# Patient Record
Sex: Male | Born: 1980 | Race: White | Hispanic: No | Marital: Married | Smoking: Former smoker
Health system: Southern US, Community
[De-identification: ages and names within clinical notes are randomized; demographics above are authoritative.]

## PROBLEM LIST (undated history)

## (undated) DIAGNOSIS — F988 Other specified behavioral and emotional disorders with onset usually occurring in childhood and adolescence: Secondary | ICD-10-CM

## (undated) DIAGNOSIS — S42301A Unspecified fracture of shaft of humerus, right arm, initial encounter for closed fracture: Secondary | ICD-10-CM

## (undated) DIAGNOSIS — S82892A Other fracture of left lower leg, initial encounter for closed fracture: Secondary | ICD-10-CM

## (undated) DIAGNOSIS — N200 Calculus of kidney: Secondary | ICD-10-CM

## (undated) HISTORY — DX: Unspecified fracture of shaft of humerus, right arm, initial encounter for closed fracture: S42.301A

## (undated) HISTORY — DX: Other specified behavioral and emotional disorders with onset usually occurring in childhood and adolescence: F98.8

## (undated) HISTORY — PX: APPENDECTOMY: SHX54

## (undated) HISTORY — PX: HERNIA REPAIR: SHX51

## (undated) HISTORY — DX: Other fracture of left lower leg, initial encounter for closed fracture: S82.892A

## (undated) HISTORY — DX: Calculus of kidney: N20.0

---

## 2006-01-18 ENCOUNTER — Ambulatory Visit: Payer: Self-pay | Admitting: Family Medicine

## 2006-02-26 ENCOUNTER — Ambulatory Visit: Payer: Self-pay | Admitting: Family Medicine

## 2006-05-11 ENCOUNTER — Ambulatory Visit: Payer: Self-pay | Admitting: Family Medicine

## 2006-08-14 ENCOUNTER — Emergency Department (HOSPITAL_COMMUNITY): Admission: EM | Admit: 2006-08-14 | Discharge: 2006-08-14 | Payer: Self-pay | Admitting: Emergency Medicine

## 2006-08-16 ENCOUNTER — Encounter: Payer: Self-pay | Admitting: Family Medicine

## 2007-04-02 ENCOUNTER — Ambulatory Visit: Payer: Self-pay | Admitting: Family Medicine

## 2007-04-02 DIAGNOSIS — K429 Umbilical hernia without obstruction or gangrene: Secondary | ICD-10-CM | POA: Insufficient documentation

## 2007-04-02 DIAGNOSIS — R350 Frequency of micturition: Secondary | ICD-10-CM

## 2007-04-02 LAB — CONVERTED CEMR LAB
Blood in Urine, dipstick: NEGATIVE
Glucose, Urine, Semiquant: NEGATIVE
Protein, U semiquant: NEGATIVE
Urobilinogen, UA: 0.2
WBC Urine, dipstick: NEGATIVE
pH: 7

## 2007-05-02 ENCOUNTER — Telehealth (INDEPENDENT_AMBULATORY_CARE_PROVIDER_SITE_OTHER): Payer: Self-pay | Admitting: *Deleted

## 2007-05-03 ENCOUNTER — Ambulatory Visit: Payer: Self-pay | Admitting: Family Medicine

## 2007-05-06 LAB — CONVERTED CEMR LAB
Albumin: 4.6 g/dL (ref 3.5–5.2)
Basophils Relative: 0.3 % (ref 0.0–1.0)
Bilirubin, Direct: 0.2 mg/dL (ref 0.0–0.3)
Eosinophils Absolute: 0 10*3/uL (ref 0.0–0.6)
GFR calc Af Amer: 133 mL/min
HCT: 45.4 % (ref 39.0–52.0)
Hemoglobin: 15.2 g/dL (ref 13.0–17.0)
Lymphocytes Relative: 12.1 % (ref 12.0–46.0)
MCHC: 33.5 g/dL (ref 30.0–36.0)
MCV: 93.3 fL (ref 78.0–100.0)
Monocytes Relative: 9.4 % (ref 3.0–11.0)
Neutrophils Relative %: 77.7 % — ABNORMAL HIGH (ref 43.0–77.0)
Platelets: 203 10*3/uL (ref 150–400)
Potassium: 4 meq/L (ref 3.5–5.1)
Sodium: 136 meq/L (ref 135–145)
TSH: 0.94 microintl units/mL (ref 0.35–5.50)
Total Bilirubin: 1.6 mg/dL — ABNORMAL HIGH (ref 0.3–1.2)
Total Protein: 7.5 g/dL (ref 6.0–8.3)

## 2007-09-20 ENCOUNTER — Ambulatory Visit: Payer: Self-pay | Admitting: Internal Medicine

## 2007-09-20 DIAGNOSIS — F410 Panic disorder [episodic paroxysmal anxiety] without agoraphobia: Secondary | ICD-10-CM

## 2007-10-31 ENCOUNTER — Ambulatory Visit: Payer: Self-pay | Admitting: Family Medicine

## 2007-10-31 DIAGNOSIS — F988 Other specified behavioral and emotional disorders with onset usually occurring in childhood and adolescence: Secondary | ICD-10-CM | POA: Insufficient documentation

## 2007-12-02 ENCOUNTER — Ambulatory Visit: Payer: Self-pay | Admitting: Family Medicine

## 2008-01-02 ENCOUNTER — Ambulatory Visit: Payer: Self-pay | Admitting: Family Medicine

## 2008-01-19 IMAGING — CR DG ABDOMEN 2V
2 series · 2 of 2 positions shown · non-contrast
Comparison: none

CLINICAL DATA: Abdominal pain and vomiting.  Distention.  
 ABDOMEN ? 2 VIEW:

[w abdomen upright]
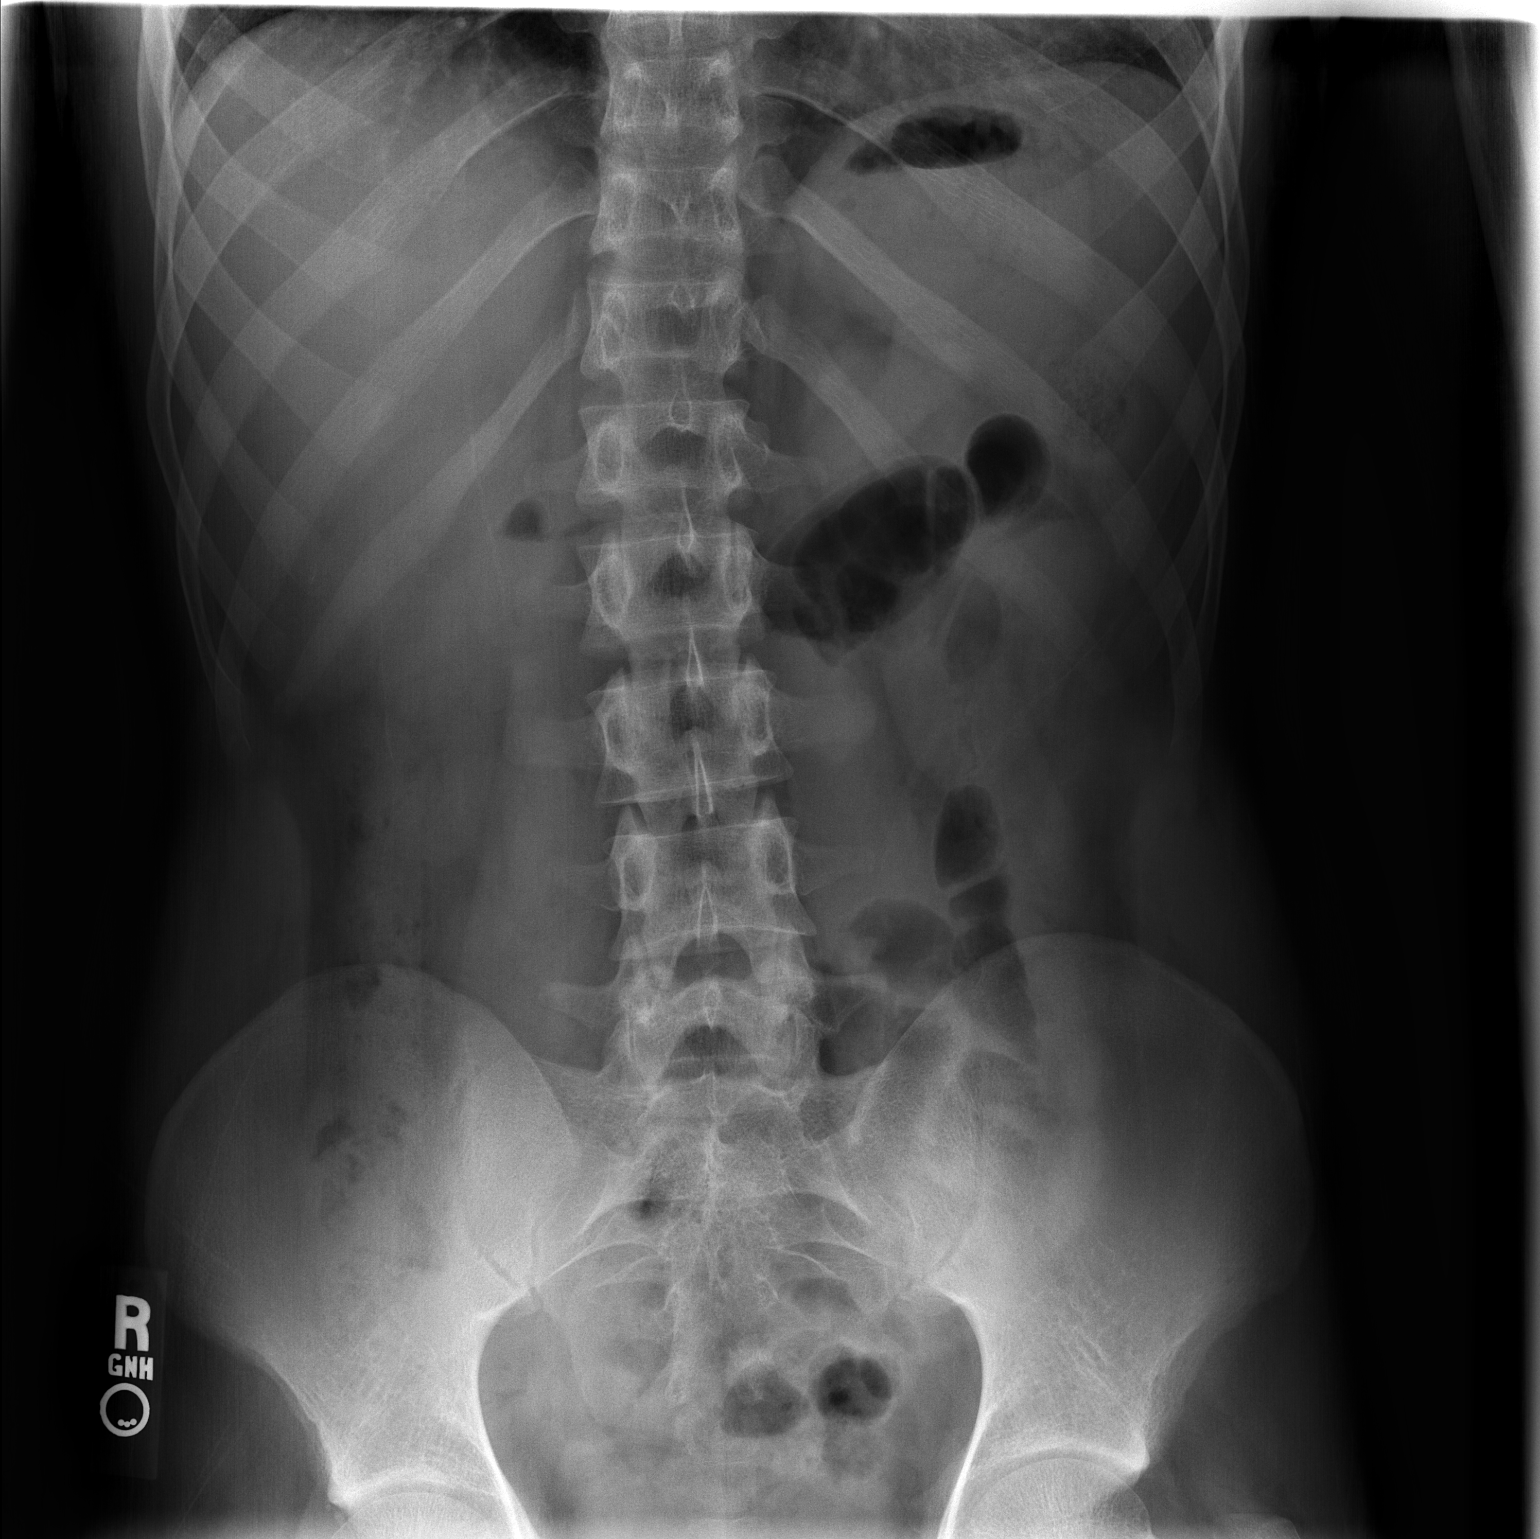

[t abdomen supine]
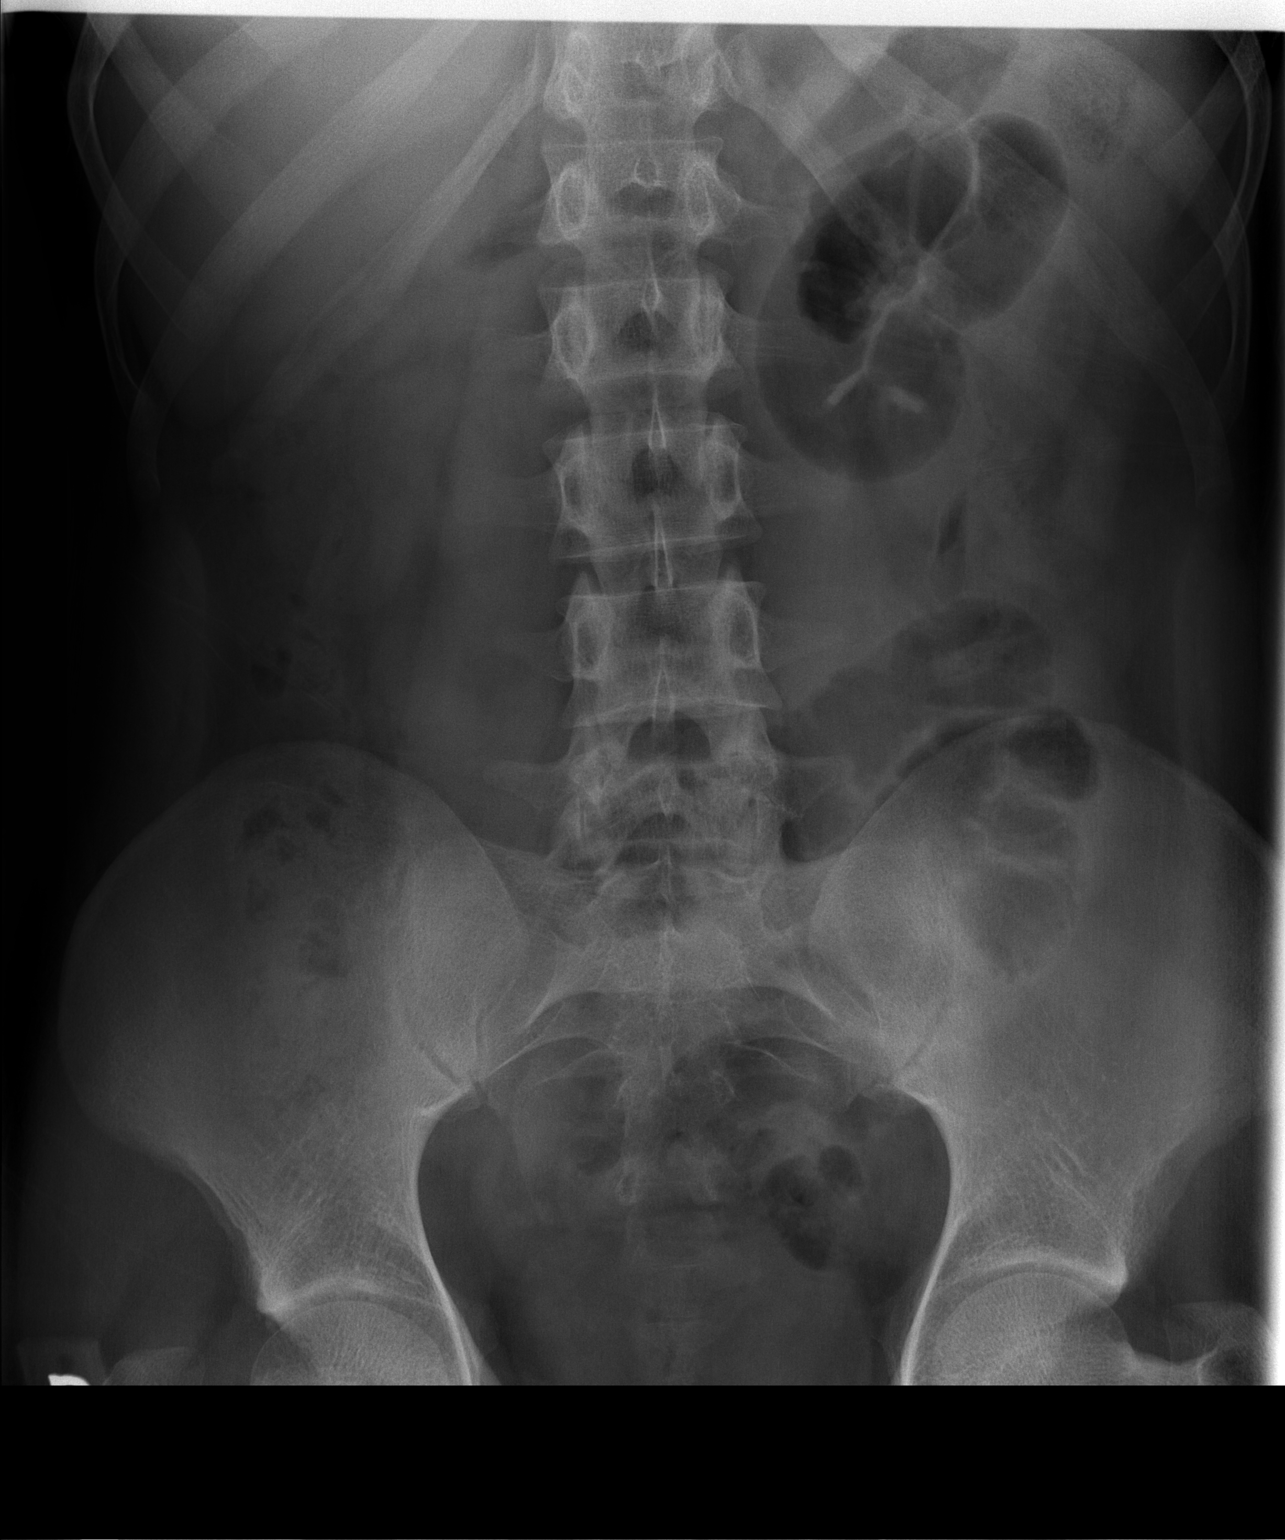

[2 of 2 positions shown; findings below may reference images not displayed]

FINDINGS: Multiple gas-filled nondilated small bowel loops are seen within the left abdomen.  Scattered bowel gas is seen throughout the colon.  This is a nonspecific, nonobstructive bowel gas pattern.  No radiopaque calculi are seen.  There is no evidence of free intraperitoneal air.
IMPRESSION: Nonspecific, nonobstructive bowel gas pattern.

## 2008-01-29 ENCOUNTER — Telehealth (INDEPENDENT_AMBULATORY_CARE_PROVIDER_SITE_OTHER): Payer: Self-pay | Admitting: *Deleted

## 2008-03-05 ENCOUNTER — Telehealth (INDEPENDENT_AMBULATORY_CARE_PROVIDER_SITE_OTHER): Payer: Self-pay | Admitting: Internal Medicine

## 2008-04-16 ENCOUNTER — Telehealth (INDEPENDENT_AMBULATORY_CARE_PROVIDER_SITE_OTHER): Payer: Self-pay | Admitting: Internal Medicine

## 2008-05-19 ENCOUNTER — Telehealth (INDEPENDENT_AMBULATORY_CARE_PROVIDER_SITE_OTHER): Payer: Self-pay | Admitting: Internal Medicine

## 2008-07-16 ENCOUNTER — Ambulatory Visit: Payer: Self-pay | Admitting: Family Medicine

## 2008-07-16 DIAGNOSIS — N200 Calculus of kidney: Secondary | ICD-10-CM

## 2008-08-26 ENCOUNTER — Telehealth (INDEPENDENT_AMBULATORY_CARE_PROVIDER_SITE_OTHER): Payer: Self-pay | Admitting: Internal Medicine

## 2008-12-18 ENCOUNTER — Telehealth (INDEPENDENT_AMBULATORY_CARE_PROVIDER_SITE_OTHER): Payer: Self-pay | Admitting: Internal Medicine

## 2009-01-12 ENCOUNTER — Ambulatory Visit: Payer: Self-pay | Admitting: Family Medicine

## 2009-02-03 ENCOUNTER — Telehealth (INDEPENDENT_AMBULATORY_CARE_PROVIDER_SITE_OTHER): Payer: Self-pay | Admitting: Internal Medicine

## 2009-03-03 ENCOUNTER — Ambulatory Visit: Payer: Self-pay | Admitting: Family Medicine

## 2009-03-05 ENCOUNTER — Ambulatory Visit: Payer: Self-pay | Admitting: Family Medicine

## 2009-04-06 ENCOUNTER — Telehealth (INDEPENDENT_AMBULATORY_CARE_PROVIDER_SITE_OTHER): Payer: Self-pay | Admitting: Internal Medicine

## 2009-06-18 ENCOUNTER — Telehealth: Payer: Self-pay | Admitting: Family Medicine

## 2010-03-28 ENCOUNTER — Ambulatory Visit: Payer: Self-pay | Admitting: Family Medicine

## 2010-04-28 ENCOUNTER — Ambulatory Visit: Payer: Self-pay | Admitting: Internal Medicine

## 2010-07-14 ENCOUNTER — Telehealth: Payer: Self-pay | Admitting: Family Medicine

## 2010-08-31 ENCOUNTER — Telehealth: Payer: Self-pay | Admitting: Family Medicine

## 2010-09-20 ENCOUNTER — Ambulatory Visit
Admission: RE | Admit: 2010-09-20 | Discharge: 2010-09-20 | Payer: Self-pay | Source: Home / Self Care | Attending: Family Medicine | Admitting: Family Medicine

## 2010-09-21 ENCOUNTER — Telehealth: Payer: Self-pay | Admitting: Family Medicine

## 2010-09-22 ENCOUNTER — Encounter: Payer: Self-pay | Admitting: Family Medicine

## 2010-09-22 ENCOUNTER — Telehealth: Payer: Self-pay | Admitting: Family Medicine

## 2010-09-27 NOTE — Progress Notes (Signed)
  Phone Note Call from Patient Call back at Work Phone [phone]   Caller: Patient Call For: Dr.Duncan Summary of Call: Pt was seeing Dr.Gutierrez.  He transferred to him from [place].  Pt's father,Gary Boom,saw you and really like you.  So,pt would like to transfer from Dr Sharen Hones to you. Can pt. transfer to you? Initial call taken by: Beau Fanny,  July 14, 2010 11:59 AM  Follow-up for Phone Call        If okay with Dr. Reece Agar.  If okay with you, please notify the staff to change the patient.  Follow-up by: Crawford Givens MD,  July 14, 2010 1:10 PM  Additional Follow-up for Phone Call Additional follow up Details #1::        fine by me.   Additional Follow-up by: Eustaquio Boyden  MD,  July 14, 2010 1:14 PM    Additional Follow-up for Phone Call Additional follow up Details #2::    L/M on pt's a.m. to let pt know he can schedule appt. w/ Dr.Duncan. Follow-up by: Beau Fanny,  July 14, 2010 1:29 PM  Additional Follow-up for Phone Call Additional follow up Details #3:: Details for Additional Follow-up Action Taken: noted.  Additional Follow-up by: Crawford Givens MD,  July 14, 2010 1:45 PM

## 2010-09-27 NOTE — Assessment & Plan Note (Signed)
Summary: TRANSFER FROM BILLIE/REFILL MED/CLE   Vital Signs:  Patient profile:   30 year old male Height:      69.5 inches Weight:      161.75 pounds BMI:     23.63 Temp:     98.2 degrees F oral Pulse rate:   64 / minute Pulse rhythm:   regular BP sitting:   118 / 72  (left arm) Cuff size:   regular  Vitals Entered By: Selena Batten Dance CMA Duncan Dull) (March 28, 2010 3:52 PM) CC: Refill meds   History of Present Illness: CC: refill meds  30 yo with h/o ADD and nephrolitihiaisis presents to establish and for refill of pain meds and ritalin.  Off ritalin for 4-5 months 2/2 insurance.  Feels having trouble concentrating at work without ritalin.  Did take ADD test in middle school and had parents/teachers fill out form.  Has come here since high school.  Does get stomach upset with ritalin.  Last kidney stone 1 year ago (usually gets vicodin from urologist but $$$ for appt).  Drinks plenty of water to control kidney stones.  Preventive Screening-Counseling & Management  Alcohol-Tobacco     Alcohol drinks/day: <1  Current Medications (verified): 1)  Vicodin 5-500 Mg  Tabs (Hydrocodone-Acetaminophen) .... 1/2 To 1 By Mouth Two Times A Day As Needed Pain 2)  Ritalin 10 Mg  Tabs (Methylphenidate Hcl) .... 1/2 Each Morning, Then 1 Mid Day For Add  Allergies: 1)  ! Sulfa  Past History:  Past Medical History: ADD dx in middle school h/o Kidney stones, sees urology and prescribed vicodin  Past Surgical History: Appendectomy umbilical herniorrhaphy as child Right arm fracture, no surgery Left ankle fracture, no surgery  Family History: Mother with breast cancer. No other cancers, HTN, DM, CVA, CAD  Social History: social smoker (cigarettes), EtOH on weekends, no rec drugs. Marital Status: married 02/26/2010 Children: 0 Occupation:courier in morning, print shop in afternoons  Review of Systems       per HPI  Physical Exam  General:  alert, well-developed, well-nourished, and  well-hydrated.   Mouth:  Oral mucosa and oropharynx without lesions or exudates.  Teeth in good repair. Lungs:  Normal respiratory effort, chest expands symmetrically. Lungs are clear to auscultation, no crackles or wheezes. Heart:  normal rate, regular rhythm, and no murmur.   Abdomen:  soft, non-tender, normal bowel sounds, no distention, no masses, no hepatomegaly, and no splenomegaly.   Extremities:  no edema either lower legs   Impression & Recommendations:  Problem # 1:  ADD (ICD-314.00) no records of ADD w/u in our paper chart.  Have sent release of info to Dr. Clarene Duke (pediatrician).  Advised if unable to obtain, would send to rpt testing.  Have refilled ritalin for now, however consider strattera vs TCA to help with adult ADD dx in future.  currently BP stable, weight/appetite stable.  Problem # 2:  RENAL CALCULUS (ICD-592.0) hx. refilled vicodin to have in case as another stone.  In future, change to NSAID/flomax combo as more effective.  Complete Medication List: 1)  Vicodin 5-500 Mg Tabs (Hydrocodone-acetaminophen) .... 1/2 to 1 by mouth two times a day as needed pain 2)  Ritalin 10 Mg Tabs (Methylphenidate hcl) .... 1/2 each morning, then 1 mid day for add  Patient Instructions: 1)  Please return in 1 month for follow up. 2)  Refill of ritalin and vicodin provided today. 3)  We don't have copy of your ADD work up so please call  us with your pediatrician's name. 4)  Pleasure to meet you today. Prescriptions: RITALIN 10 MG  TABS (METHYLPHENIDATE HCL) 1/2 each morning, then 1 mid day for ADD  #45 x 0   Entered and Authorized by:   Eustaquio Boyden  MD   Signed by:   Eustaquio Boyden  MD on 03/28/2010   Method used:   Print then Give to Patient   RxID:   [id number] VICODIN 5-500 MG  TABS (HYDROCODONE-ACETAMINOPHEN) 1/2 to 1 by mouth two times a day as needed pain  #30 x 0   Entered and Authorized by:   Eustaquio Boyden  MD   Signed by:   Eustaquio Boyden  MD on  03/28/2010   Method used:   Print then Give to Patient   RxID:   [id number]   Current Allergies (reviewed today): ! SULFA

## 2010-09-27 NOTE — Assessment & Plan Note (Signed)
Summary: ONE MONTH FOLLOW UP / LFW   Vital Signs:  Patient profile:   30 year old male Weight:      157 pounds Temp:     97.7 degrees F oral Pulse rate:   64 / minute Pulse rhythm:   regular BP sitting:   116 / 76  (left arm) Cuff size:   regular  Vitals Entered By: Selena Batten Dance CMA (AAMA) (April 28, 2010 4:14 PM) CC: 1 month follow up   History of Present Illness: CC: f/u ADHD  On ritalin taking 1 in morning and one after lunch.  Still giving stomach upset with med.  Slight appetite decrease, but no weight changes, HA.  Work seems to pick up in fall, slows during holidays and summer.  At work has to help sort, Neurosurgeon and print.  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  Ritalin affects golf game negatively.  Was on zoloft for panic attacks, didn't like side effects.    Dr. Clarene Duke - used to be [facility] pediatrics in [place].  never received records requested.  per patient quit smoking.  Allergies: 1)  ! Sulfa  Past History:  Social History: Last updated: 03/28/2010 social smoker (cigarettes), EtOH on weekends, no rec drugs. 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   Psych:  Cognition and judgment appear intact. Alert and cooperative with normal attention span and concentration. No apparent delusions, illusions, hallucinations   Impression & Recommendations:  Problem # 1:  ADD (ICD-314.00) still awaiting results from Dr. Fredirick Maudlin office.  If still no results in 1 month, will refer for testing.  Pt asks about adderall instead of ritalin as ritalin upsets stomache.  Rec trial of strattera.  RTC 1 mo for f/u.  BP remains stable, weight/appetite stable.  could cohnsider TCA (nortriptyline) as well.  discussed side effects of   Complete Medication List: 1)  Vicodin 5-500 Mg Tabs  (Hydrocodone-acetaminophen) .... 1/2 to 1 by mouth two times a day as needed pain 2)  Strattera 40 Mg Caps (Atomoxetine hcl) .... One by mouth daily  Patient Instructions: 1)  Please return in 1 month for follow up on ADHD. 2)  If we haven't received records from Dr. Clarene Duke, we will set you up with testing.  Rec stay off meds for 1 week prior. 3)  Start Medical sales representative. Prescriptions: STRATTERA 40 MG CAPS (ATOMOXETINE HCL) one by mouth daily  #30 x 1   Entered and Authorized by:   Eustaquio Boyden  MD   Signed by:   Eustaquio Boyden  MD on 04/28/2010   Method used:   [facility]        [facility] [facility]retail)       [address]       [place], [place]  [address]       Ph: [phone] or [phone]       Fax: [phone]   RxID:   [id number]   Current Allergies (reviewed today): ! SULFA

## 2010-09-28 ENCOUNTER — Telehealth: Payer: Self-pay | Admitting: Family Medicine

## 2010-09-29 NOTE — Progress Notes (Addendum)
Summary: prior auth needed for ritalin  Phone Note From Pharmacy   [facility] [id number] Summary of Call: Prior Berkley Harvey is needed for ritalin, form is on your desk.                Lowella Petties CMA, AAMA  September 22, 2010 11:36 AM   Follow-up for Phone Call        signed, please send in. thanks.  Follow-up by: Crawford Givens MD,  September 22, 2010 6:54 PM  Additional Follow-up for Phone Call Additional follow up Details #1::        Faxed and given to [facility]. Additional Follow-up by: Delilah Shan CMA (AAMA),  September 23, 2010 9:05 AM     Appended Document: prior auth needed for ritalin Prior auth given for ritalin, pharmacy advised.  Approval letter placed on doctor's desk for signture and scanning.

## 2010-09-29 NOTE — Assessment & Plan Note (Signed)
Vital Signs:  Patient profile:   30 year old male Height:      69.5 inches Weight:      160 pounds BMI:     23.37 Temp:     98.4 degrees F oral Pulse rate:   92 / minute Pulse rhythm:   regular BP sitting:   112 / 70  (left arm) Cuff size:   regular  Vitals Entered By: Delilah Shan CMA Duncan Dull) (September 20, 2010 4:06 PM) CC: Discuss med for ADHD, Hypertension Management   History of Present Illness: Insurance changed and he was asking about options at this point.  Per patient was tested and dx'd with ADD in childhood.  He wanted to come off meds in high school.  Was on ritalin when workload increased after high school.  Did okay with ritalin in the morning and afternoon with as needed dosing.  Some portions of the years were harder than others and his med use varied.  Wanted to change to time release formulation now.  He was worried about potential mood changes with the strattera didn't start this.    Compliant with meds: see above.  Off meds for months.   benefit from med (ie increase in concentration): yes, when on ritalin change in mood: no change in appetite:minimal Insomnia:no tremor: at first, but this resolved compliant with behavioral modification: yes, patient is working to improve this. some looser stools with ritalin last fall.  this was tolerable per patient, since he took it as needed.      Hypertension History:      Negative major cardiovascular risk factors include male age less than 67 years old and non-tobacco-user status.    Allergies: 1)  ! Sulfa  Past History:  Past Medical History: Last updated: 03/28/2010 ADD dx in middle school h/o Kidney stones, sees urology and prescribed vicodin  Past Surgical History: Last updated: 03/28/2010 Appendectomy umbilical herniorrhaphy as child Right arm fracture, no surgery Left ankle fracture, no surgery  Family History: Reviewed history from 03/28/2010 and no changes required. Father alive, healthy Mother  with breast cancer in remission as of 2012 No other cancers, HTN, DM, CVA, CAD  Social History: Reviewed history from 03/28/2010 and no changes required. social smoker (cigarettes), EtOH on weekends, no rec drugs. 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   enjoys golf, basketball  Review of Systems       See HPI.  Otherwise negative.    Physical Exam  General:  GEN: nad, alert and oriented, affect wnl and appropriate HEENT: mucous membranes moist NECK: supple w/o LA CV: rrr.  PULM: ctab, no inc wob ABD: soft, +bs EXT: no edema CN 2-12 wnl, s/s/dtr wnl x4.  No tremor.     Impression & Recommendations:  Problem # 1:  ADD (ICD-314.00) Start ritalin LA and notify the clinic if intolerant.  Call back with update in 2 weeks if not before.  He knows not to abuse, misuse, or take the medicine other than as prescribed.  He is aware of potential mood changes, sleep and appetite changes, along with need to follow up periodically for BP check and reeval.  He agrees.  >25 min spent with patient, at least half of which was spent on counseling [id number].  He is aware of behavioral modifications needed to help with ADD.    Complete Medication List: 1)  Vicodin 5-500  Mg Tabs (Hydrocodone-acetaminophen) .... 1/2 to 1 by mouth two times a day as needed pain 2)  Valtrex 1 Gm Tabs (Valacyclovir hcl) .... 2 by mouth two times a day for 1 day when oral blisters appear; follow up with md if not improving as needed 3)  Ritalin La 10 Mg Xr24h-cap (Methylphenidate hcl) .Marland Kitchen.. 1 by mouth qam  Hypertension Assessment/Plan:      The patient's hypertensive risk group is category A: No risk factors and no target organ damage.  Today's blood pressure is 112/70.     Patient Instructions: 1)  Talk to the pharmacy and see if there is a prior authorization on the medicine.  If there is, get  them to send me the form that I need to fill out.  I would take 1 tab in the AM and then call me with an update in 10-14 days, sooner if needed.  If you notice any problems on the medicine (sleep or appetite changes), call me.  Take care.   Prescriptions: RITALIN LA 10 MG XR24H-CAP (METHYLPHENIDATE HCL) 1 by mouth qAM  #30 x 0   Entered and Authorized by:   Crawford Givens MD   Signed by:   Crawford Givens MD on 09/20/2010   Method used:   Print then Give to Patient   RxID:   [id number]    Orders Added: 1)  Est. Patient Level IV [14782]    Current Allergies (reviewed today): ! SULFA

## 2010-09-29 NOTE — Assessment & Plan Note (Signed)
Summary: REFILL MED FOR ADHD/CLE  see prev note.   Allergies: 1)  ! Sulfa   Complete Medication List: 1)  Vicodin 5-500 Mg Tabs (Hydrocodone-acetaminophen) .... 1/2 to 1 by mouth two times a day as needed pain 2)  Valtrex 1 Gm Tabs (Valacyclovir hcl) .... 2 by mouth two times a day for 1 day when oral blisters appear; follow up with md if not improving as needed 3)  Ritalin La 10 Mg Xr24h-cap (Methylphenidate hcl) .Marland Kitchen.. 1 by mouth qam

## 2010-09-29 NOTE — Progress Notes (Signed)
Summary: wants valtrex   Phone Note Call from Patient Call back at Home Phone [phone] Call back at Work Phone [phone]   Caller: Patient Call For: Todd Givens MD Summary of Call: Patient is asking if he can get a rx for valtrex [facility]  He says that he has been having trouble with fever blisters.  Initial call taken by: Melody Comas,  August 31, 2010 3:05 PM  Follow-up for Phone Call        sent. Todd Givens MD  August 31, 2010 4:39 PM     New/Updated Medications: VALTREX 1 GM TABS (VALACYCLOVIR HCL) 2 by mouth two times a day for 1 day when oral blisters appear; follow up with MD if not improving Prescriptions: VALTREX 1 GM TABS (VALACYCLOVIR HCL) 2 by mouth two times a day for 1 day when oral blisters appear; follow up with MD if not improving  #10 x 1   Entered and Authorized by:   Todd Givens MD   Signed by:   Todd Givens MD on 08/31/2010   Method used:   [facility]        [facility]  [place] (retail)       [address]       [place], [place]  [address]       Ph: [phone] or [phone]       Fax: [phone]   RxID:   [id number]

## 2010-09-29 NOTE — Progress Notes (Signed)
Summary: ritalin  Phone Note Refill Request Message from:  Fax from Pharmacy on September 21, 2010 1:51 PM  Refills Requested: Medication #1:  RITALIN LA 10 MG XR24H-CAP 1 by mouth qAM.   Last Refilled: Jan 20, 1983 [facility] [phone]  Initial call taken by: Melody Comas,  September 21, 2010 1:52 PM  Follow-up for Phone Call        rx was printed and given to patient yesterday.  Please see if there is another concern, ie a PA that needs to be done.   Follow-up by: Crawford Givens MD,  September 21, 2010 5:29 PM  Additional Follow-up for Phone Call Additional follow up Details #1::        PA is on Dr. Lianne Bushy desk.  Additional Follow-up by: Melody Comas,  September 22, 2010 1:03 PM

## 2010-09-30 ENCOUNTER — Encounter: Payer: Self-pay | Admitting: Family Medicine

## 2010-09-30 ENCOUNTER — Telehealth: Payer: Self-pay | Admitting: Family Medicine

## 2010-10-05 NOTE — Progress Notes (Signed)
Summary: ritalin LA is too costly  Phone Note Call from Patient Call back at Work Phone [phone]   Caller: Patient Call For: Crawford Givens MD Summary of Call: Pt states his ritalin script is too expensive.  Pharmacist suggested timed released adderall or concerta.  If these wouldnt work pt will go back to plain  ritalin, can take 2 a day.  He has his ritalin LA script and will bring that back in when he picks up new script. Initial call taken by: Lowella Petties CMA, AAMA,  September 28, 2010 11:26 AM  Follow-up for Phone Call        He needs to call his insurance and see what they will cover.  I'll look at the options.   Follow-up by: Crawford Givens MD,  September 28, 2010 12:06 PM  Additional Follow-up for Phone Call Additional follow up Details #1::        Left message with male voice to have pt. return call.  Lugene Fuquay CMA (AAMA)  September 28, 2010 5:22 PM   Advised pt, he will call back.           Lowella Petties CMA, AAMA  September 29, 2010 9:26 AM Pt called back.  Adderall XR is covered by his insurance, so he wants to try that.  Please call when script is ready, he will bring in ritalin script. Additional Follow-up by: Lowella Petties CMA, AAMA,  September 29, 2010 9:54 AM    Additional Follow-up for Phone Call Additional follow up Details #2::    printed and signed, please have him turn in the old rx when he picks up the new one.  Document that the old rx was destroyed. Crawford Givens MD  September 29, 2010 10:08 AM   Patient Advised. Prescription left at front desk with instructions to collect the old Rx.  Lugene Fuquay CMA Osmar Howton Dull)  September 29, 2010 10:44 AM   Patient brought in old Rx. to be destroyed.  Picked up new Rx.  Lugene Fuquay CMA (AAMA)  September 29, 2010 4:55 PM   New/Updated Medications: ADDERALL XR 10 MG XR24H-CAP (AMPHETAMINE-DEXTROAMPHETAMINE) 1 by mouth once daily  in the AM Prescriptions: ADDERALL XR 10 MG XR24H-CAP (AMPHETAMINE-DEXTROAMPHETAMINE) 1 by mouth  once daily  in the AM  #30 x 0   Entered and Authorized by:   Crawford Givens MD   Signed by:   Crawford Givens MD on 09/29/2010   Method used:   Print then Give to Patient   RxID:   [id number]

## 2010-10-05 NOTE — Progress Notes (Signed)
Summary: prior Berkley Harvey is needed for adderall  Phone Note From Pharmacy   [facility] [id number] Summary of Call: Prior Berkley Harvey is now needed for adderall, form is on your desk.              Lowella Petties CMA, AAMA  September 30, 2010 10:33 AM   Follow-up for Phone Call        done, please fax. Crawford Givens MD  September 30, 2010 11:02 AM   Faxed and given to [place]. Lugene Fuquay CMA (AAMA)  September 30, 2010 11:17 AM

## 2010-10-13 NOTE — Medication Information (Signed)
Summary: Prior authorization  Prior authorization   Imported By: Kassie Mends 10/04/2010 08:18:01  _____________________________________________________________________  External Attachment:    Type:   Image     Comment:   External Document

## 2010-10-25 NOTE — Medication Information (Signed)
Summary: Prior Authorization & Approval for Adderall/[facility]  Prior Authorization & Approval for Adderall/[facility]   Imported By: Lanelle Bal 10/17/2010 15:37:03  _____________________________________________________________________  External Attachment:    Type:   Image     Comment:   External Document

## 2010-11-07 ENCOUNTER — Telehealth: Payer: Self-pay | Admitting: Family Medicine

## 2010-11-15 NOTE — Progress Notes (Signed)
Summary: refill request for adderall, valtrex  Phone Note Refill Request Call back at Work Phone [phone] Message from:  Patient  Refills Requested: Medication #1:  ADDERALL XR 10 MG XR24H-CAP 1 by mouth once daily  in the AM.  Medication #2:  VALTREX 1 GM TABS 2 by mouth two times a day for 1 day when oral blisters appear; follow up with MD if not improving as needed Please call pt when ready.  He has one adderall to take tomorrow.  Initial call taken by: Lowella Petties CMA, AAMA,  November 07, 2010 3:13 PM  Follow-up for Phone Call        please give to patient.  thanks. Crawford Givens MD  November 07, 2010 5:24 PM   Patient Advised. Prescription left at front desk.  Follow-up by: Delilah Shan CMA (AAMA),  November 08, 2010 8:14 AM    Prescriptions: ADDERALL XR 10 MG XR24H-CAP (AMPHETAMINE-DEXTROAMPHETAMINE) 1 by mouth once daily  in the AM  #30 x 0   Entered and Authorized by:   Crawford Givens MD   Signed by:   Crawford Givens MD on 11/07/2010   Method used:   Print then Give to Patient   RxID:   [id number] VALTREX 1 GM TABS (VALACYCLOVIR HCL) 2 by mouth two times a day for 1 day when oral blisters appear; follow up with MD if not improving as needed  #20 x 1   Entered and Authorized by:   Crawford Givens MD   Signed by:   Crawford Givens MD on 11/07/2010   Method used:   Print then Give to Patient   RxID:   [id number]

## 2010-12-19 ENCOUNTER — Other Ambulatory Visit: Payer: Self-pay | Admitting: *Deleted

## 2010-12-19 MED ORDER — AMPHETAMINE-DEXTROAMPHET ER 10 MG PO CP24: 10.0000 mg | ORAL_CAPSULE | ORAL | Status: DC

## 2010-12-19 NOTE — Telephone Encounter (Signed)
Printed, please give to patient.  

## 2010-12-19 NOTE — Telephone Encounter (Signed)
Patient advised, Rx. Left at front desk.

## 2011-01-30 ENCOUNTER — Other Ambulatory Visit: Payer: Self-pay | Admitting: *Deleted

## 2011-01-30 MED ORDER — AMPHETAMINE-DEXTROAMPHET ER 10 MG PO CP24: 10.0000 mg | ORAL_CAPSULE | ORAL | Status: DC

## 2011-01-30 NOTE — Telephone Encounter (Signed)
Please call pt when ready.

## 2011-01-30 NOTE — Telephone Encounter (Signed)
Please  give to patient

## 2011-01-31 NOTE — Telephone Encounter (Signed)
Patient notified that rx is up front and ready for pickup. 

## 2011-02-27 ENCOUNTER — Other Ambulatory Visit: Payer: Self-pay | Admitting: *Deleted

## 2011-02-27 NOTE — Telephone Encounter (Signed)
Please call patient when rx are ready

## 2011-02-27 NOTE — Telephone Encounter (Signed)
Will print at clinic. Then please give to pt.  Thanks.

## 2011-02-28 MED ORDER — AMPHETAMINE-DEXTROAMPHET ER 10 MG PO CP24: 10.0000 mg | ORAL_CAPSULE | ORAL | Status: DC

## 2011-02-28 MED ORDER — VALACYCLOVIR HCL 1 G PO TABS: ORAL_TABLET | ORAL | Status: DC

## 2011-02-28 NOTE — Telephone Encounter (Signed)
Please give to pt.  

## 2011-02-28 NOTE — Telephone Encounter (Signed)
Patient picked up written rx at front desk.

## 2011-04-10 ENCOUNTER — Other Ambulatory Visit: Payer: Self-pay | Admitting: *Deleted

## 2011-04-10 MED ORDER — AMPHETAMINE-DEXTROAMPHET ER 10 MG PO CP24: 10.0000 mg | ORAL_CAPSULE | ORAL | Status: DC

## 2011-04-10 NOTE — Telephone Encounter (Signed)
Patient notified that rx is up front and ready for pickup. 

## 2011-04-10 NOTE — Telephone Encounter (Signed)
Please give to pt and set up an OV for this fall.  Thanks.

## 2011-04-10 NOTE — Telephone Encounter (Signed)
Please call when ready.

## 2011-05-26 ENCOUNTER — Other Ambulatory Visit: Payer: Self-pay | Admitting: *Deleted

## 2011-05-26 NOTE — Telephone Encounter (Signed)
Please call pt when ready.

## 2011-05-28 MED ORDER — AMPHETAMINE-DEXTROAMPHET ER 10 MG PO CP24: 10.0000 mg | ORAL_CAPSULE | ORAL | Status: DC

## 2011-05-28 NOTE — Telephone Encounter (Signed)
Please give to pt and schedule for 30 min OV in next 3 months.  Thanks.

## 2011-05-29 MED ORDER — AMPHETAMINE-DEXTROAMPHET ER 10 MG PO CP24: 10.0000 mg | ORAL_CAPSULE | ORAL | Status: DC

## 2011-05-29 NOTE — Telephone Encounter (Signed)
Prev rx destroyed, new rx printed.

## 2011-05-29 NOTE — Telephone Encounter (Signed)
Where is Rx

## 2011-05-29 NOTE — Telephone Encounter (Signed)
Patient advised.  Rx. Left at front desk for pick up. 

## 2011-07-24 ENCOUNTER — Other Ambulatory Visit: Payer: Self-pay | Admitting: *Deleted

## 2011-07-24 MED ORDER — AMPHETAMINE-DEXTROAMPHET ER 10 MG PO CP24: 10.0000 mg | ORAL_CAPSULE | ORAL | Status: DC

## 2011-07-24 NOTE — Telephone Encounter (Signed)
Please give to pt and schedule a yearly ADD visit.  Thanks.

## 2011-07-24 NOTE — Telephone Encounter (Signed)
Please call pt when ready.

## 2011-07-24 NOTE — Telephone Encounter (Signed)
Pt. Advised.  Rx. Left at front desk for pick up.

## 2011-09-04 ENCOUNTER — Other Ambulatory Visit: Payer: Self-pay | Admitting: *Deleted

## 2011-09-04 MED ORDER — AMPHETAMINE-DEXTROAMPHET ER 10 MG PO CP24: 10.0000 mg | ORAL_CAPSULE | ORAL | Status: DC

## 2011-09-04 NOTE — Telephone Encounter (Signed)
Patient called stating that he needs a refill on his Adderall. Please call when ready for pickup.

## 2011-09-04 NOTE — Telephone Encounter (Signed)
Schedule f/u this spring, visit.  Thanks.  rx printed.

## 2011-09-04 NOTE — Telephone Encounter (Signed)
Patient advised.  Rx left at front desk for pick up. 

## 2011-11-14 ENCOUNTER — Other Ambulatory Visit: Payer: Self-pay | Admitting: *Deleted

## 2011-11-15 MED ORDER — AMPHETAMINE-DEXTROAMPHET ER 10 MG PO CP24: 10.0000 mg | ORAL_CAPSULE | ORAL | Status: DC

## 2011-11-15 NOTE — Telephone Encounter (Signed)
Patient advised.  Rx left at front desk for pick up. 

## 2011-11-15 NOTE — Telephone Encounter (Signed)
Need OV.  Printed.

## 2011-12-08 ENCOUNTER — Encounter: Payer: Self-pay | Admitting: Family Medicine

## 2011-12-11 ENCOUNTER — Encounter: Payer: Self-pay | Admitting: Family Medicine

## 2011-12-11 ENCOUNTER — Ambulatory Visit: Payer: Self-pay | Admitting: Family Medicine

## 2011-12-11 ENCOUNTER — Ambulatory Visit (INDEPENDENT_AMBULATORY_CARE_PROVIDER_SITE_OTHER): Payer: Self-pay | Admitting: Family Medicine

## 2011-12-11 VITALS — BP 98/58 | HR 72 | Temp 98.6°F | Ht 70.0 in | Wt 155.8 lb

## 2011-12-11 DIAGNOSIS — F988 Other specified behavioral and emotional disorders with onset usually occurring in childhood and adolescence: Secondary | ICD-10-CM

## 2011-12-11 NOTE — Patient Instructions (Signed)
Call the pharmacy and ask about the prior authorization.  Don't change your meds in the meantime.  Take care.

## 2011-12-11 NOTE — Progress Notes (Signed)
ADD Compliant with meds: yes, taking everyday benefit from med (ie increase in concentration): yes change in mood: no change in appetite: intermittent, can decrease appetite mildly Insomnia: no Tremor: no  compliant with behavioral modification: yes  ROS: See HPI.  Otherwise noncontributory.   Meds, vitals, and allergies reviewed.   GEN: nad, alert and oriented, affect wnl and appropriate HEENT: mucous membranes moist NECK: supple w/o LA CV: rrr.  PULM: ctab, no inc wob ABD: soft, +bs EXT: no edema CN 2-12 wnl, s/s/dtr wnl x4.  No tremor.

## 2011-12-12 NOTE — Assessment & Plan Note (Signed)
Doing well, continue current meds.  He'll check on the PA for the meds at the pharmacy.  Continue as is, he'll notify us if there are more troubles with the PA.  D/w pt about lifestyle interventions to get the most use out of meds.

## 2011-12-19 ENCOUNTER — Telehealth: Payer: Self-pay | Admitting: Family Medicine

## 2011-12-19 ENCOUNTER — Telehealth: Payer: Self-pay | Admitting: *Deleted

## 2011-12-19 NOTE — Telephone Encounter (Signed)
Received faxed request from pharmacy for prior authorization on Amphetamine ER 10 mg. Form from [facility] is in you in box to complete and fax back.

## 2011-12-19 NOTE — Telephone Encounter (Signed)
Pt has insurance with [employer] and said that he received a bill and insurance should have paid. Sending to billing/coding for review.  Advised patient to call patient accounting customer service number listed on statement to follow up as well.

## 2011-12-19 NOTE — Telephone Encounter (Deleted)
Received request from pharmacy for prior authorization on Amphetamine ER 10 mg.  Forms are in your in box from [facility] to complete and fax back.

## 2011-12-22 NOTE — Telephone Encounter (Signed)
Done, please scan and send in.  Thanks.

## 2011-12-22 NOTE — Telephone Encounter (Signed)
Approval received today, given to Rena to attach to PA request.

## 2011-12-26 NOTE — Telephone Encounter (Signed)
Sent to [employer] for review and follow up with patient.

## 2012-02-02 NOTE — Telephone Encounter (Signed)
Patient did not present with insurance card and we called back and obtained insurance and updated his information.  Billing was rebilling with new insurance information and it should be paid by insurance.  Called patient and informed him.

## 2012-02-06 ENCOUNTER — Other Ambulatory Visit: Payer: Self-pay

## 2012-02-06 MED ORDER — AMPHETAMINE-DEXTROAMPHET ER 10 MG PO CP24: 10.0000 mg | ORAL_CAPSULE | ORAL | Status: DC

## 2012-02-06 NOTE — Telephone Encounter (Signed)
Printed.  Is on old med list.  Give to pt when signed.

## 2012-02-06 NOTE — Telephone Encounter (Signed)
Pt request rx for Adderall XR 10 mg. Not on med list. Call when ready for pick up.

## 2012-02-07 NOTE — Telephone Encounter (Signed)
Left message on cell phone voice mail advising patient script is ready for pick up. 

## 2012-03-11 ENCOUNTER — Other Ambulatory Visit: Payer: Self-pay

## 2012-03-11 NOTE — Telephone Encounter (Signed)
Pt left v/m requesting rx Adderall. Call when ready for pick up. 

## 2012-03-12 MED ORDER — AMPHETAMINE-DEXTROAMPHET ER 10 MG PO CP24: 10.0000 mg | ORAL_CAPSULE | ORAL | Status: DC

## 2012-03-12 NOTE — Telephone Encounter (Signed)
Printed, please give to patient after I sign. Thanks.

## 2012-03-12 NOTE — Telephone Encounter (Signed)
Patient advised.  Rx left at front desk for pick up. 

## 2012-04-17 ENCOUNTER — Other Ambulatory Visit: Payer: Self-pay

## 2012-04-17 MED ORDER — AMPHETAMINE-DEXTROAMPHET ER 10 MG PO CP24: 10.0000 mg | ORAL_CAPSULE | ORAL | Status: DC

## 2012-04-17 NOTE — Telephone Encounter (Signed)
Pt request rx Adderall.call when ready for pick up. 

## 2012-04-17 NOTE — Telephone Encounter (Signed)
Done. Please give to patient.

## 2012-04-17 NOTE — Telephone Encounter (Signed)
Patient notified by telephone that rx is up front and ready for pickup. 

## 2012-05-31 ENCOUNTER — Other Ambulatory Visit: Payer: Self-pay

## 2012-05-31 MED ORDER — AMPHETAMINE-DEXTROAMPHET ER 10 MG PO CP24: 10.0000 mg | ORAL_CAPSULE | ORAL | Status: DC

## 2012-05-31 NOTE — Telephone Encounter (Signed)
Patient advised.  Rx left at front desk for pick up. 

## 2012-05-31 NOTE — Telephone Encounter (Signed)
Pt request rx for adderall. Call when ready for pick up. 

## 2012-05-31 NOTE — Telephone Encounter (Signed)
Please give to patient.  Thanks. 

## 2012-08-05 ENCOUNTER — Other Ambulatory Visit: Payer: Self-pay

## 2012-08-05 NOTE — Telephone Encounter (Signed)
Pt left v/m requesting rx adderall. Call when ready for pick up. 

## 2012-08-06 MED ORDER — AMPHETAMINE-DEXTROAMPHET ER 10 MG PO CP24: 10.0000 mg | ORAL_CAPSULE | ORAL | Status: DC

## 2012-08-06 NOTE — Telephone Encounter (Signed)
Please give to patient.  Thanks. 

## 2012-08-06 NOTE — Telephone Encounter (Signed)
Patient advised.  Rx left at front desk for pick up. 

## 2012-10-30 ENCOUNTER — Other Ambulatory Visit: Payer: Self-pay

## 2012-10-30 MED ORDER — VALACYCLOVIR HCL 1 G PO TABS: ORAL_TABLET | ORAL | Status: DC

## 2012-10-30 MED ORDER — AMPHETAMINE-DEXTROAMPHET ER 10 MG PO CP24: 10.0000 mg | ORAL_CAPSULE | ORAL | Status: DC

## 2012-10-30 MED ORDER — HYDROCODONE-ACETAMINOPHEN 5-325 MG PO TABS: 0.5000 | ORAL_TABLET | Freq: Two times a day (BID) | ORAL | Status: DC | PRN

## 2012-10-30 NOTE — Telephone Encounter (Signed)
Valtrex sent, others printed.  I'll sign when I get to clinic.

## 2012-10-30 NOTE — Telephone Encounter (Signed)
Patient notified as instructed by telephone v/m that valtrex was sent to [facility] and adderall and hydrocodone apap rx is at front desk for pick up.

## 2012-10-30 NOTE — Telephone Encounter (Signed)
Pt request written rx for adderall and hydrocodone apap;? no longer can get the hydrocodone apap 5-500; can the apap be reduced to 325?Marland Kitchen Pt also request valtrex sent electronically to [facility] due to having a fever blister now.Please advise. Call when rx ready for pick up.

## 2013-01-08 ENCOUNTER — Other Ambulatory Visit: Payer: Self-pay

## 2013-01-08 MED ORDER — AMPHETAMINE-DEXTROAMPHET ER 10 MG PO CP24: 10.0000 mg | ORAL_CAPSULE | ORAL | Status: DC

## 2013-01-08 NOTE — Telephone Encounter (Signed)
Patient notified that script is up front and ready for pickup. Patient stated that he will schedule the office visit when he comes in to pick the script up.

## 2013-01-08 NOTE — Telephone Encounter (Signed)
Pt request rx Adderall XR. Call when ready for pick up.

## 2013-01-08 NOTE — Telephone Encounter (Signed)
Printed.  Needs OV scheduled.

## 2013-01-22 ENCOUNTER — Encounter: Payer: Self-pay | Admitting: Family Medicine

## 2013-01-22 ENCOUNTER — Telehealth: Payer: Self-pay | Admitting: Family Medicine

## 2013-01-22 DIAGNOSIS — F988 Other specified behavioral and emotional disorders with onset usually occurring in childhood and adolescence: Secondary | ICD-10-CM

## 2013-01-22 NOTE — Telephone Encounter (Signed)
Have rep from toxicology call the patient.   Based on policy, he can't get an other rxs until he signs/follows through with screening.   If he was given the rx, I would notify the pharmacy that we can't fill anything else unless he meets the clinic requirements.  If the rx wasn't given, then destroy the rx.  Thanks.  I updated his chart.

## 2013-01-22 NOTE — Telephone Encounter (Signed)
Patient came in to pick up Adderall XR prescription.  I scheduled a follow up appointment per Dr. Lianne Bushy note on prescription envelope.  Todd Reed requested 3 weeks, scheduled for 02/12/13 3:30 pm.  Printed the controlled substance agreement and gave to patient, explaining that he would need to go through to the lab prior to getting his prescription.  He handed agreement back to me and stated he was "not doing that" he just wanted to get his prescription.  He stated he did not have time and was going on vacation.  Todd Reed took the appointment sheet for his 02/12/13 appointment time and stated that he may have to cancel the appointment and start getting his medication in [place].

## 2013-01-23 NOTE — Telephone Encounter (Signed)
Spoke with Hansel Starling and this pt was advise of all of this information by her yesterday when he refused to get sign the agreement, and the front desk already destroyed his Rx

## 2013-02-12 ENCOUNTER — Ambulatory Visit: Payer: BC Managed Care – PPO | Admitting: Family Medicine

## 2013-02-19 ENCOUNTER — Encounter: Payer: Self-pay | Admitting: Family Medicine

## 2013-02-19 ENCOUNTER — Telehealth: Payer: Self-pay | Admitting: Family Medicine

## 2013-02-19 NOTE — Telephone Encounter (Signed)
Noted, thanks!

## 2013-02-19 NOTE — Telephone Encounter (Signed)
Todd Reed came in the office today wanting to pick up a rx. After looking at previous notes, I informed the patient that he needs to update his controlled substance agreement. I also told him that since he declined to update his agreement the last time, and cancelled his appointment on 02/12/13, he would need to make another appointment and do the urinalysis before he gets this med refilled. I explained to him that this process would not take much of his time. He said he was at work and would come back later.

## 2014-01-28 ENCOUNTER — Telehealth: Payer: Self-pay

## 2014-01-28 MED ORDER — VALACYCLOVIR HCL 1 G PO TABS: ORAL_TABLET | ORAL | Status: DC

## 2014-01-28 NOTE — Telephone Encounter (Signed)
Pt left v/m; pt has fever blister and request refill on valacyclovir to [facility]. Pt last seen 12/11/11 and no future appt scheduled. Please advise.

## 2014-01-28 NOTE — Telephone Encounter (Signed)
Sent, no other refills until he can re-est care.

## 2014-04-16 ENCOUNTER — Encounter (INDEPENDENT_AMBULATORY_CARE_PROVIDER_SITE_OTHER): Payer: Self-pay

## 2014-04-16 ENCOUNTER — Ambulatory Visit (INDEPENDENT_AMBULATORY_CARE_PROVIDER_SITE_OTHER): Payer: BC Managed Care – PPO | Admitting: Family Medicine

## 2014-04-16 ENCOUNTER — Encounter: Payer: Self-pay | Admitting: Family Medicine

## 2014-04-16 ENCOUNTER — Encounter: Payer: Self-pay | Admitting: *Deleted

## 2014-04-16 VITALS — BP 102/64 | HR 67 | Temp 98.0°F | Wt 153.8 lb

## 2014-04-16 DIAGNOSIS — R21 Rash and other nonspecific skin eruption: Secondary | ICD-10-CM

## 2014-04-16 DIAGNOSIS — N200 Calculus of kidney: Secondary | ICD-10-CM

## 2014-04-16 DIAGNOSIS — F988 Other specified behavioral and emotional disorders with onset usually occurring in childhood and adolescence: Secondary | ICD-10-CM

## 2014-04-16 MED ORDER — DOXYCYCLINE HYCLATE 100 MG PO TABS: 100.0000 mg | ORAL_TABLET | Freq: Two times a day (BID) | ORAL | Status: DC

## 2014-04-16 MED ORDER — TRIAMCINOLONE ACETONIDE 0.1 % EX CREA: 1.0000 "application " | TOPICAL_CREAM | Freq: Two times a day (BID) | CUTANEOUS | Status: DC

## 2014-04-16 MED ORDER — HYDROCODONE-ACETAMINOPHEN 5-325 MG PO TABS: 0.5000 | ORAL_TABLET | Freq: Two times a day (BID) | ORAL | Status: DC | PRN

## 2014-04-16 NOTE — Patient Instructions (Signed)
This looks like irritation, less likely to be infection.  I would use the triamcinolone cream first.  If the redness and itching get better, then just use that cream until it resolves.   If the redness gets worse or bigger (or if you have a fever), then start the doxycycline and notify us.

## 2014-04-16 NOTE — Progress Notes (Signed)
Pre visit review using our clinic review tool, if applicable. No additional management support is needed unless otherwise documented below in the visit note.  "Spider bite".  R upper arm with red lesion.  Noted by patient recently.  No known sting or bite.  Had been working out in the yard.  No FCNAVD.  Area is red, itchy prev, not itchy now.  Not sore now.  The red area hasn't changed much in the last 24 hours.  No pain with ROM, biceps contraction.    H/o renal stones, had used vicodin episodically.  Working to inc his fluids, cut back on soda and tea.  No stones in the last few months.    Meds, vitals, and allergies reviewed.   ROS: See HPI.  Otherwise, noncontributory.  nad ncat Mmm Neck supple.  No LA rrr ctab 6x7 cm area of superficial mild redness with cental small epithelial disruption.  No fluctuance, no mass o/w.  Mildly warm but not ttp at all.  No LA in the axilla.

## 2014-04-16 NOTE — Assessment & Plan Note (Signed)
I wrote rx for vicodin and directed patient to the lab waiting area after UDS protocol explained.  He said he understood the plan. He walked through the waiting area to exit the building.  He didn't give urine for UDS.  rx was not picked up.  I destroyed the rx, witnessed by LF.  DO NOT FILL CONTROLLED MEDS UNLESS PATIENT GOES THROUGH TOXICOLOGY SCREENING.

## 2014-04-16 NOTE — Assessment & Plan Note (Signed)
Looks to be local reaction (h/o itching noted), with irritation but not infection. Would use TAC for now, border marked.  If not better, if spreading erythema, then start doxy and notify us.  Routine cautions given.  He understood.  Hold doxy for now.

## 2014-05-18 ENCOUNTER — Other Ambulatory Visit: Payer: Self-pay | Admitting: Family Medicine

## 2014-05-19 NOTE — Telephone Encounter (Signed)
Sent!

## 2014-05-19 NOTE — Telephone Encounter (Signed)
Last filled 01/28/2014

## 2014-08-11 ENCOUNTER — Ambulatory Visit (INDEPENDENT_AMBULATORY_CARE_PROVIDER_SITE_OTHER): Payer: BC Managed Care – PPO | Admitting: Family Medicine

## 2014-08-11 ENCOUNTER — Encounter: Payer: Self-pay | Admitting: Family Medicine

## 2014-08-11 VITALS — BP 106/60 | HR 76 | Temp 98.6°F | Wt 152.5 lb

## 2014-08-11 DIAGNOSIS — R590 Localized enlarged lymph nodes: Secondary | ICD-10-CM

## 2014-08-11 NOTE — Patient Instructions (Signed)
Ibuprofen if needed for the muscle pain in your neck.  The mass feels like a lymph node and that should gradually get better on its own.  Take care.

## 2014-08-11 NOTE — Progress Notes (Signed)
Pre visit review using our clinic review tool, if applicable. No additional management support is needed unless otherwise documented below in the visit note.  Had been sick with a cold in the last week, mult sick contacts at home.  Didn't have neck lesion until recently- Knot on the R side of the neck today.   It was sore yesterday but he didn't notice the mass until today.  Cold sx are resolved.   No other neck masses.  No taste changes.  No fevers.  Former smoker.   No other lumps or masses anywhere else.    Red area on the R side of the face for the last few days.  Using topical TAC.  No clear trigger.    nad ncat Tm wnl Nasal exam slightly stuffy, no erythema.  OP wnl, no ulceration Neck supple, Isolated LN on R side of neck, small, slightly tender. No other LN on neck, near the pinna B or along the clavicles.  R SCM ttp.  Small faint erythematous area on R cheek, not ttp rrr ctab

## 2014-08-12 DIAGNOSIS — R590 Localized enlarged lymph nodes: Secondary | ICD-10-CM | POA: Insufficient documentation

## 2014-08-12 NOTE — Assessment & Plan Note (Signed)
Likely reactive, not worrisome on exam.  D/w pt.  F/u if worsening or not resolved.   The rash on his face is small and likely incidental, can continue prn topical tx.

## 2015-04-01 ENCOUNTER — Ambulatory Visit (INDEPENDENT_AMBULATORY_CARE_PROVIDER_SITE_OTHER): Payer: BC Managed Care – PPO | Admitting: Family Medicine

## 2015-04-01 ENCOUNTER — Encounter: Payer: Self-pay | Admitting: Family Medicine

## 2015-04-01 VITALS — BP 104/64 | HR 76 | Temp 98.7°F | Wt 152.2 lb

## 2015-04-01 DIAGNOSIS — J029 Acute pharyngitis, unspecified: Secondary | ICD-10-CM | POA: Diagnosis not present

## 2015-04-01 DIAGNOSIS — J020 Streptococcal pharyngitis: Secondary | ICD-10-CM

## 2015-04-01 LAB — POCT RAPID STREP A (OFFICE): Rapid Strep A Screen: NEGATIVE

## 2015-04-01 MED ORDER — LIDOCAINE VISCOUS 2 % MT SOLN: 5.0000 mL | OROMUCOSAL | Status: DC | PRN

## 2015-04-01 MED ORDER — AMOXICILLIN 875 MG PO TABS: 875.0000 mg | ORAL_TABLET | Freq: Two times a day (BID) | ORAL | Status: AC

## 2015-04-01 NOTE — Progress Notes (Signed)
Pre visit review using our clinic review tool, if applicable. No additional management support is needed unless otherwise documented below in the visit note.  Sx started about 2 days ago.  First noted fatigue.  Then with diffuse aches.  Then noted ST yesterday, worse last night and today.  No vomiting.  Did have some chills last night, has felt hot.  No rash.  No ear pain.  No facial pain.  No cough.  No rhinorrhea.  No known sick contacts.    Meds, vitals, and allergies reviewed.   ROS: See HPI.  Otherwise, noncontributory.  GEN: nad, alert and oriented HEENT: mucous membranes moist, tm w/o erythema, scant nasal exam w/o erythema, clear discharge noted,  OP with erythema and B exudates NECK: supple w/tender LA CV: rrr.   PULM: ctab, no inc wob EXT: no edema SKIN: no acute rash

## 2015-04-01 NOTE — Patient Instructions (Signed)
Presumed strep.  Start amoxil. Drink plenty of fluids, take tylenol as needed, and gargle with warm salt water for your throat.  This should gradually improve.  Take care.  Let us know if you have other concerns.    

## 2015-04-02 DIAGNOSIS — J020 Streptococcal pharyngitis: Secondary | ICD-10-CM | POA: Insufficient documentation

## 2015-04-02 NOTE — Assessment & Plan Note (Signed)
rst neg but has likely fever, has LA, ST, exudates, no cough.  D/w pt.  Would treat.  Amoxil and fu prn.  Can use lidocaine prn ST.  Nontoxic.

## 2015-10-19 ENCOUNTER — Encounter: Payer: Self-pay | Admitting: Family Medicine

## 2015-10-19 ENCOUNTER — Ambulatory Visit (INDEPENDENT_AMBULATORY_CARE_PROVIDER_SITE_OTHER): Payer: BC Managed Care – PPO | Admitting: Family Medicine

## 2015-10-19 VITALS — BP 110/78 | HR 95 | Temp 99.2°F | Ht 69.5 in | Wt 155.5 lb

## 2015-10-19 DIAGNOSIS — R509 Fever, unspecified: Secondary | ICD-10-CM | POA: Diagnosis not present

## 2015-10-19 DIAGNOSIS — J101 Influenza due to other identified influenza virus with other respiratory manifestations: Secondary | ICD-10-CM

## 2015-10-19 LAB — POCT INFLUENZA A/B
INFLUENZA A, POC: POSITIVE — AB
Influenza B, POC: NEGATIVE

## 2015-10-19 MED ORDER — OSELTAMIVIR PHOSPHATE 75 MG PO CAPS: 75.0000 mg | ORAL_CAPSULE | Freq: Two times a day (BID) | ORAL | Status: DC

## 2015-10-19 NOTE — Progress Notes (Signed)
Pre visit review using our clinic review tool, if applicable. No additional management support is needed unless otherwise documented below in the visit note. 

## 2015-10-19 NOTE — Progress Notes (Signed)
   Subjective:    Patient ID: Todd Reed, male    DOB: April 27, 1981, 35 y.o.   MRN: [id number]  Cough This is a new problem. The current episode started yesterday. The problem has been rapidly worsening. The cough is productive of sputum. Associated symptoms include a fever, headaches and nasal congestion. Pertinent negatives include no chills, ear pain, rhinorrhea, shortness of breath or wheezing. The symptoms are aggravated by lying down. Risk factors: nonsmoker. Treatments tried: nightime cold and flu med. There is no history of asthma, COPD, emphysema, environmental allergies or pneumonia.  Fever  This is a new problem. The current episode started yesterday. The maximum temperature noted was 103 to 103.9 F. Associated symptoms include coughing and headaches. Pertinent negatives include no ear pain or wheezing.   No sick contacts.   Social History /Family History/Past Medical History reviewed and updated if needed.    Review of Systems  Constitutional: Positive for fever. Negative for chills.  HENT: Negative for ear pain and rhinorrhea.   Respiratory: Positive for cough. Negative for shortness of breath and wheezing.   Allergic/Immunologic: Negative for environmental allergies.  Neurological: Positive for headaches.       Objective:   Physical Exam  Constitutional: Vital signs are normal. He appears well-developed and well-nourished.  Non-toxic appearance. He has a sickly appearance. He appears ill. No distress.  HENT:  Head: Normocephalic.  Right Ear: Hearing normal.  Left Ear: Hearing normal.  Nose: Mucosal edema and rhinorrhea present. Right sinus exhibits no maxillary sinus tenderness and no frontal sinus tenderness. Left sinus exhibits no maxillary sinus tenderness and no frontal sinus tenderness.  Mouth/Throat: Oropharynx is clear and moist and mucous membranes are normal.  Neck: Trachea normal. Carotid bruit is not present. No thyroid mass and no thyromegaly present.    Cardiovascular: Normal rate, regular rhythm and normal pulses.  Exam reveals no gallop, no distant heart sounds and no friction rub.   No murmur heard. No peripheral edema  Pulmonary/Chest: Effort normal and breath sounds normal. No respiratory distress.  Skin: Skin is warm, dry and intact. No rash noted.  Psychiatric: He has a normal mood and affect. His speech is normal and behavior is normal. Thought content normal.          Assessment & Plan:

## 2015-10-19 NOTE — Patient Instructions (Signed)

## 2015-10-19 NOTE — Assessment & Plan Note (Signed)
Discussed symptomatic care.  Hydration, rest. Call if SOB, cough worsening or prolongued fever. Reviewed flu timeline. He has no health problems that put her at risk for complications , but pt decided to use of tamiflu. Discussed family prophylaxis, He was advised to not return to work until 24 hour after fever resolved on no antipyretics.

## 2017-02-22 ENCOUNTER — Other Ambulatory Visit: Payer: Self-pay

## 2017-02-22 MED ORDER — VALACYCLOVIR HCL 1 G PO TABS: ORAL_TABLET | ORAL | 0 refills | Status: AC

## 2017-02-22 NOTE — Telephone Encounter (Signed)
Pt left v/m; pt has fever blister and requesting refill on valtrex. (last refilled # 12 on 05/19/14). Pt last seen acute visit on 10/19/15 and no recent f/u or cpx; no future appt scheduled.Please advise.[facility] [place].

## 2017-02-22 NOTE — Telephone Encounter (Signed)
Sent.  Needs to schedule CPE when possible.  Thanks.

## 2017-02-23 NOTE — Telephone Encounter (Signed)
Left detailed message on voicemail.  

## 2019-10-24 DIAGNOSIS — F900 Attention-deficit hyperactivity disorder, predominantly inattentive type: Secondary | ICD-10-CM | POA: Insufficient documentation

## 2019-10-26 ENCOUNTER — Encounter: Payer: Self-pay | Admitting: Emergency Medicine

## 2019-10-26 ENCOUNTER — Other Ambulatory Visit: Payer: Self-pay

## 2019-10-26 DIAGNOSIS — Z87891 Personal history of nicotine dependence: Secondary | ICD-10-CM | POA: Diagnosis not present

## 2019-10-26 DIAGNOSIS — N50811 Right testicular pain: Secondary | ICD-10-CM | POA: Insufficient documentation

## 2019-10-26 DIAGNOSIS — R2242 Localized swelling, mass and lump, left lower limb: Secondary | ICD-10-CM | POA: Insufficient documentation

## 2019-10-26 DIAGNOSIS — Z79899 Other long term (current) drug therapy: Secondary | ICD-10-CM | POA: Insufficient documentation

## 2019-10-26 LAB — CBC WITH DIFFERENTIAL/PLATELET
Abs Immature Granulocytes: 0.02 10*3/uL (ref 0.00–0.07)
Basophils Absolute: 0 10*3/uL (ref 0.0–0.1)
Basophils Relative: 0 %
Eosinophils Absolute: 0.1 10*3/uL (ref 0.0–0.5)
Eosinophils Relative: 1 %
HCT: 44.2 % (ref 39.0–52.0)
Hemoglobin: 15.5 g/dL (ref 13.0–17.0)
Immature Granulocytes: 0 %
Lymphocytes Relative: 19 %
Lymphs Abs: 1.6 10*3/uL (ref 0.7–4.0)
MCH: 30.5 pg (ref 26.0–34.0)
MCHC: 35.1 g/dL (ref 30.0–36.0)
MCV: 87 fL (ref 80.0–100.0)
Monocytes Absolute: 0.7 10*3/uL (ref 0.1–1.0)
Monocytes Relative: 8 %
Neutro Abs: 6.1 10*3/uL (ref 1.7–7.7)
Neutrophils Relative %: 72 %
Platelets: 229 10*3/uL (ref 150–400)
RBC: 5.08 MIL/uL (ref 4.22–5.81)
RDW: 11.8 % (ref 11.5–15.5)
WBC: 8.5 10*3/uL (ref 4.0–10.5)
nRBC: 0 % (ref 0.0–0.2)

## 2019-10-26 LAB — BASIC METABOLIC PANEL
Anion gap: 8 (ref 5–15)
BUN: 13 mg/dL (ref 6–20)
CO2: 30 mmol/L (ref 22–32)
Calcium: 9.6 mg/dL (ref 8.9–10.3)
Chloride: 101 mmol/L (ref 98–111)
Creatinine, Ser: 0.92 mg/dL (ref 0.61–1.24)
GFR calc Af Amer: >60 mL/min (ref >60)
GFR calc non Af Amer: >60 mL/min (ref >60)
Glucose, Bld: 104 mg/dL — ABNORMAL HIGH (ref 70–99)
Potassium: 3.7 mmol/L (ref 3.5–5.1)
Sodium: 139 mmol/L (ref 135–145)

## 2019-10-26 NOTE — ED Triage Notes (Signed)
Pt arrives ambulatory to triage with c/o left leg swelling and pelvic pain. Pt also states that he id dizzy. Pt has been seen for the same by urgent care but states that the dizziness started today. PT is in NAD.

## 2019-10-27 ENCOUNTER — Emergency Department: Payer: BC Managed Care – PPO

## 2019-10-27 ENCOUNTER — Emergency Department
Admission: EM | Admit: 2019-10-27 | Discharge: 2019-10-27 | Disposition: A | Discharge status: Home / Self Care | Payer: BC Managed Care – PPO | Attending: Emergency Medicine | Admitting: Emergency Medicine

## 2019-10-27 DIAGNOSIS — M7989 Other specified soft tissue disorders: Secondary | ICD-10-CM

## 2019-10-27 DIAGNOSIS — N50811 Right testicular pain: Secondary | ICD-10-CM

## 2019-10-27 LAB — URINALYSIS, ROUTINE W REFLEX MICROSCOPIC
Bilirubin Urine: NEGATIVE
Glucose, UA: NEGATIVE mg/dL
Hgb urine dipstick: NEGATIVE
Ketones, ur: 20 mg/dL — AB
Leukocytes,Ua: NEGATIVE
Nitrite: NEGATIVE
Protein, ur: NEGATIVE mg/dL
Specific Gravity, Urine: 1.004 — ABNORMAL LOW (ref 1.005–1.030)
pH: 7 (ref 5.0–8.0)

## 2019-10-27 LAB — URINE CULTURE: Culture: NO GROWTH

## 2019-10-27 LAB — CHLAMYDIA/NGC RT PCR (ARMC ONLY)
Chlamydia Tr: NOT DETECTED
N gonorrhoeae: NOT DETECTED

## 2019-10-27 LAB — TROPONIN I (HIGH SENSITIVITY): Troponin I (High Sensitivity): <2 ng/L (ref <18)

## 2019-10-27 NOTE — ED Provider Notes (Signed)
[facility] Emergency Department Provider Note  ____________________________________________   First MD Initiated Contact with Patient 10/27/19 0118     (approximate)  I have reviewed the triage vital signs and the nursing notes.   HISTORY  Chief Complaint Leg swelling.  HPI Todd Reed is a 39 y.o. male with prior kidney stones who comes in with left leg swelling.  Patient states that he had intermittent left leg swelling for the past few days.  States that is worse in the morning, better during the day.  He states that at nighttime he feels like he feels a pain in the bottom of his foot and that he notices the swelling in the morning.  Denies any other trauma.  Swelling is mild, mostly around his ankle and lower leg.  Patient states that he was concerned about a blood clot which is why he came in to the ER.  He also reports that he was seen at a urgent care a few days ago for some pain in his testicles.  The pain is worse on the right.  The pain comes and goes.  Currently does not have a lot of pain at this time.  Patient was prescribed Cipro at [facility] which he has been compliant with.  Patient states that he has had new sexual contact about a month ago.  Patient denies any pain in his back or abdominal pain.   patient also reports episode yesterday where he felt like he had a panic attack.  He denies any chest pain, shortness of breath, fevers, abdominal pain, flank pain at this time.  Patient later reports that he did drink alcohol while using the Flagyl.    Past Medical History:  Diagnosis Date  . ADD (attention deficit disorder)    Dx in middle school  . Ankle fracture, left    No surgery  . Kidney stones    Sees Urology and prescribed Vicodin  . Right arm fracture    No surgery    Patient Active Problem List   Diagnosis Date Noted  . Influenza A 10/19/2015  . Streptococcal sore throat 04/02/2015  . Lymphadenopathy of right cervical region  08/12/2014  . Rash and nonspecific skin eruption 04/16/2014  . RENAL CALCULUS 07/16/2008  . ADD 10/31/2007  . PANIC ATTACK 09/20/2007  . UMBILICAL HERNIA 04/02/2007  . FREQUENCY, URINARY 04/02/2007    Past Surgical History:  Procedure Laterality Date  . APPENDECTOMY     early childhood  . HERNIA REPAIR     Umbilical, as a child    Prior to Admission medications   Medication Sig Start Date End Date Taking? Authorizing Provider  amphetamine-dextroamphetamine (ADDERALL) 20 MG tablet Take 20 mg by mouth 3 (three) times daily.    [provider]  valACYclovir (VALTREX) 1000 MG tablet TAKE 2 TABLETS BY MOUTH 2 TIMES A DAY FOR 1 DAY WHEN BLISTERS APPEAR 02/22/17   Joaquim Nam, MD  vitamin C (ASCORBIC ACID) 500 MG tablet Take 500 mg by mouth daily.    [provider]    Allergies Sulfonamide derivatives  Family History  Problem Relation Age of Onset  . Cancer Mother        Breast cancer, in remission 2012  . Hypertension Neg Hx   . Diabetes Neg Hx   . Stroke Neg Hx   . Heart disease Neg Hx     Social History Social History   Tobacco Use  . Smoking status: Former Smoker  Types: Cigarettes  . Smokeless tobacco: Never Used  . Tobacco comment: Social smoker  Substance Use Topics  . Alcohol use: Yes    Alcohol/week: 0.0 standard drinks    Comment: On weekends  . Drug use: No      Review of Systems Constitutional: No fever/chills Eyes: No visual changes. ENT: No sore throat. Cardiovascular: Denies chest pain. Respiratory: Denies shortness of breath. Gastrointestinal: No abdominal pain.  No nausea, no vomiting.  No diarrhea.  No constipation. Genitourinary: Negative for dysuria.  Positive pain in his testicles Musculoskeletal: Negative for back pain.  Positive leg swelling Skin: Negative for rash. Neurological: Negative for headaches, focal weakness or numbness.  All other ROS  negative ____________________________________________   PHYSICAL EXAM:  VITAL SIGNS: ED Triage Vitals  Enc Vitals Group     BP 10/26/19 2226 133/87     Pulse Rate 10/26/19 2226 73     Resp 10/26/19 2226 16     Temp 10/26/19 2226 97.8 F (36.6 C)     Temp Source 10/26/19 2226 Oral     SpO2 10/26/19 2226 98 %     Weight 10/26/19 2227 160 lb (72.6 kg)     Height 10/26/19 2227 5\' 9"  (1.753 m)     Head Circumference --      Peak Flow --      Pain Score 10/26/19 2233 2     Pain Loc --      Pain Edu? --      Excl. in [place]? --     Constitutional: Alert and oriented. Well appearing and in no acute distress.  Eyes: Conjunctivae are normal. EOMI. Head: Atraumatic. Nose: No congestion/rhinnorhea. Mouth/Throat: Mucous membranes are moist.   Neck: No stridor. Trachea Midline. FROM Cardiovascular: Normal rate, regular rhythm. Grossly normal heart sounds.  Good peripheral circulation. Respiratory: Normal respiratory effort.  No retractions. Lungs CTAB. Gastrointestinal: Soft and nontender. No distention. No abdominal bruits.  Musculoskeletal: No lower extremity tenderness nor edema.  No joint effusions.  Very minimal to no swelling on the left ankle. Neurologic:  Normal speech and language. No gross focal neurologic deficits are appreciated.  Skin:  Skin is warm, dry and intact. No rash noted. Psychiatric: Mood and affect are normal. Speech and behavior are normal. GU: Testicles appear normal bilaterally.  No discharge from the urethra.  No redness of the testicles.  ____________________________________________   LABS (all labs ordered are listed, but only abnormal results are displayed)  Labs Reviewed  BASIC METABOLIC PANEL - Abnormal; Notable for the following components:      Result Value   Glucose, Bld 104 (*)    All other components within normal limits  URINALYSIS, ROUTINE W REFLEX MICROSCOPIC - Abnormal; Notable for the following components:   Color, Urine STRAW (*)     APPearance CLEAR (*)    Specific Gravity, Urine 1.004 (*)    Ketones, ur 20 (*)    All other components within normal limits  CHLAMYDIA/NGC RT PCR ([facility] ONLY)  URINE CULTURE  CBC WITH DIFFERENTIAL/PLATELET  TROPONIN I (HIGH SENSITIVITY)  TROPONIN I (HIGH SENSITIVITY)   ____________________________________________   ED ECG REPORT I, Vanessa Dodge, the attending physician, personally viewed and interpreted this ECG.  EKG is normal sinus rate of 64, no ST elevation, no T wave inversions, normal intervals ____________________________________________  RADIOLOGY  Official radiology report(s): US Venous Img Lower Unilateral Left  Result Date: 10/27/2019 CLINICAL DATA:  Left leg pain and swelling EXAM: LEFT LOWER EXTREMITY VENOUS DOPPLER  ULTRASOUND TECHNIQUE: Gray-scale sonography with graded compression, as well as color Doppler and duplex ultrasound were performed to evaluate the lower extremity deep venous systems from the level of the common femoral vein and including the common femoral, femoral, profunda femoral, popliteal and calf veins including the posterior tibial, peroneal and gastrocnemius veins when visible. The superficial great saphenous vein was also interrogated. Spectral Doppler was utilized to evaluate flow at rest and with distal augmentation maneuvers in the common femoral, femoral and popliteal veins. COMPARISON:  None. FINDINGS: Contralateral Common Femoral Vein: Respiratory phasicity is normal and symmetric with the symptomatic side. No evidence of thrombus. Normal compressibility. Common Femoral Vein: No evidence of thrombus. Normal compressibility, respiratory phasicity and response to augmentation. Saphenofemoral Junction: No evidence of thrombus. Normal compressibility and flow on color Doppler imaging. Profunda Femoral Vein: No evidence of thrombus. Normal compressibility and flow on color Doppler imaging. Femoral Vein: No evidence of thrombus. Normal  compressibility, respiratory phasicity and response to augmentation. Popliteal Vein: No evidence of thrombus. Normal compressibility, respiratory phasicity and response to augmentation. Calf Veins: No evidence of thrombus. Normal compressibility and flow on color Doppler imaging. Superficial Great Saphenous Vein: No evidence of thrombus. Normal compressibility. Venous Reflux:  None. Other Findings:  None. IMPRESSION: No evidence of deep venous thrombosis. Electronically Signed   By: Alcide Clever M.D.   On: 10/27/2019 02:49   US SCROTUM W/DOPPLER  Result Date: 10/27/2019 CLINICAL DATA:  Right-sided testicular pain EXAM: SCROTAL ULTRASOUND DOPPLER ULTRASOUND OF THE TESTICLES TECHNIQUE: Complete ultrasound examination of the testicles, epididymis, and other scrotal structures was performed. Color and spectral Doppler ultrasound were also utilized to evaluate blood flow to the testicles. COMPARISON:  None. FINDINGS: Right testicle Measurements: 4.4 x 2.0 x 2.8 cm. No mass or microlithiasis visualized. Left testicle Measurements: 3.1 x 1.7 x 2.5 cm. No mass or microlithiasis visualized. Right epididymis:  Normal in size and appearance. Left epididymis: Small left epididymal cyst is noted measuring 4 mm. Hydrocele:  None visualized. Varicocele:  None visualized. Pulsed Doppler interrogation of both testes demonstrates normal low resistance arterial and venous waveforms bilaterally. IMPRESSION: Small left epididymal cyst. Normal-appearing testicles bilaterally. Electronically Signed   By: Alcide Clever M.D.   On: 10/27/2019 02:48    ____________________________________________   PROCEDURES  Procedure(s) performed (including Critical Care):  Procedures   ____________________________________________   INITIAL IMPRESSION / ASSESSMENT AND PLAN / ED COURSE  EARLY STEEL was evaluated in Emergency Department on 10/27/2019 for the symptoms described in the history of present illness. He was evaluated in  the context of the global COVID-19 pandemic, which necessitated consideration that the patient might be at risk for infection with the SARS-CoV-2 virus that causes COVID-19. Institutional protocols and algorithms that pertain to the evaluation of patients at risk for COVID-19 are in a state of rapid change based on information released by regulatory bodies including the CDC and federal and state organizations. These policies and algorithms were followed during the patient's care in the ED.    Patient is a 39 year old who comes in with multiple concerns but thinks concern is the left leg swelling of the testicle discomfort.  Will get ultrasound evaluate for DVT.  Will get ultrasound testicle to evaluate for torsion, epididymitis, orchitis.  Patient is already getting treatment with Cipro and Flagyl.  Will test for gonorrhea chlamydia.  Will get urine to evaluate for UTI although already on treatment.  No back pain or abdominal pain to suggest kidney stone at this time.  UA does not have any blood either to suggest this.  White count is normal low suspicion for infection.  Kidney function is at baseline.   Ultrasounds are reassuring.  Trop negative.  Gon/chlamydia are negative  Discussed with patient his reassuring labs.  We discussed not using alcohol while on the Flagyl.  Discussed finishing this course and that he can follow-up with his primary care doctor if symptoms are not resolving.      ____________________________________________   FINAL CLINICAL IMPRESSION(S) / ED DIAGNOSES   Final diagnoses:  Pain in right testicle  Leg swelling      MEDICATIONS GIVEN DURING THIS VISIT:  Medications - No data to display   ED Discharge Orders    None       Note:  This document was prepared using Dragon voice recognition software and may include unintentional dictation errors.   Concha Se, MD 10/27/19 [phone]

## 2019-10-27 NOTE — ED Notes (Signed)
Pt in ultasound

## 2019-10-27 NOTE — Discharge Instructions (Addendum)
Labs are reassuring.  Finish your antibiotics.  Do not drink alcohol while on these.  Follow-up with your primary care doctor if your symptoms are not resolving.  Return to the ER for any other concerns   Ultrasound was negative for DVT  IMPRESSION:  Small left epididymal cyst.   Normal-appearing testicles bilaterally.

## 2021-04-02 IMAGING — US US EXTREM LOW VENOUS*L*
1 series · 13 of 24 positions shown · non-contrast
Comparison: None.

CLINICAL DATA: Left leg pain and swelling



[Series 1: us extrem low venous*left* · 13 of 34 slices shown]
[im 1/34]
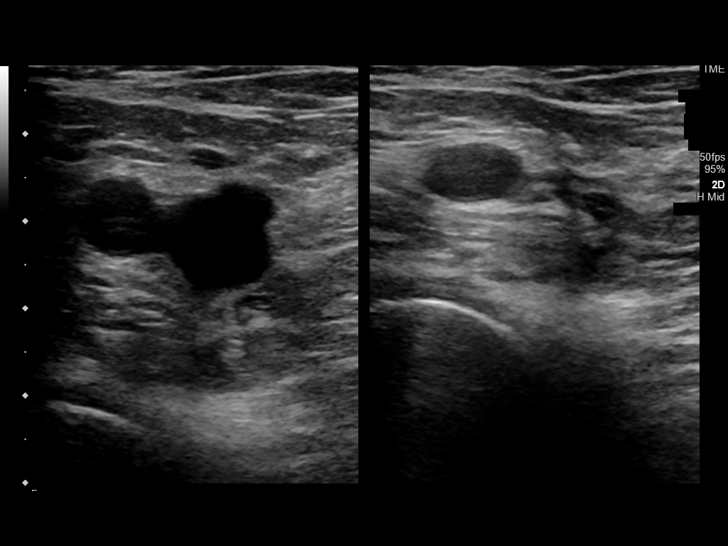
[im 3/34]
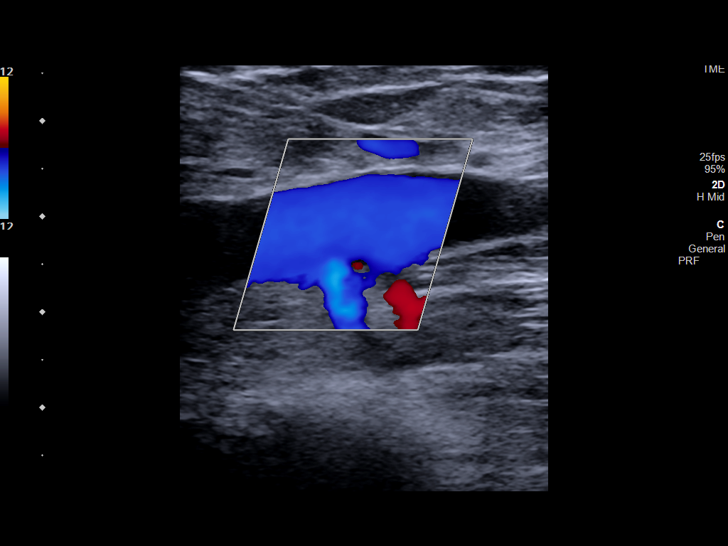
[im 6/34]
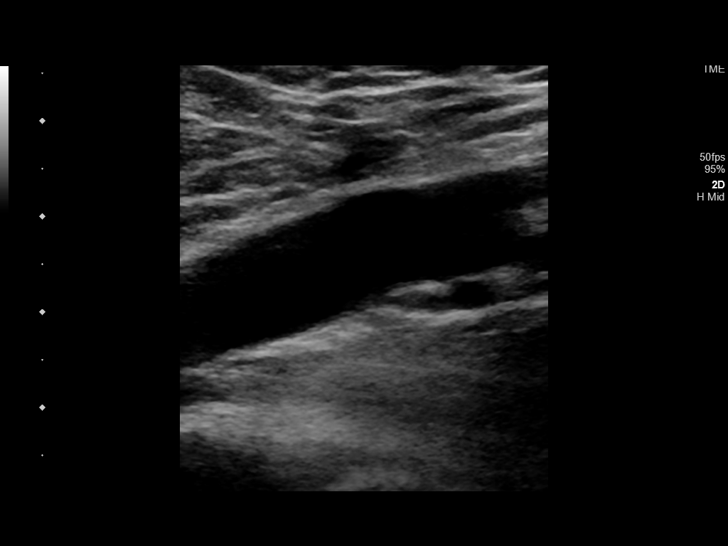
[im 9/34]
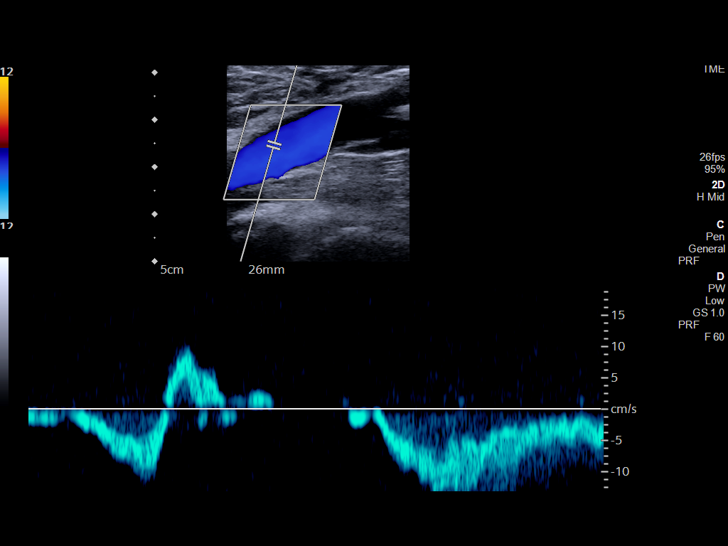
[im 12/34]
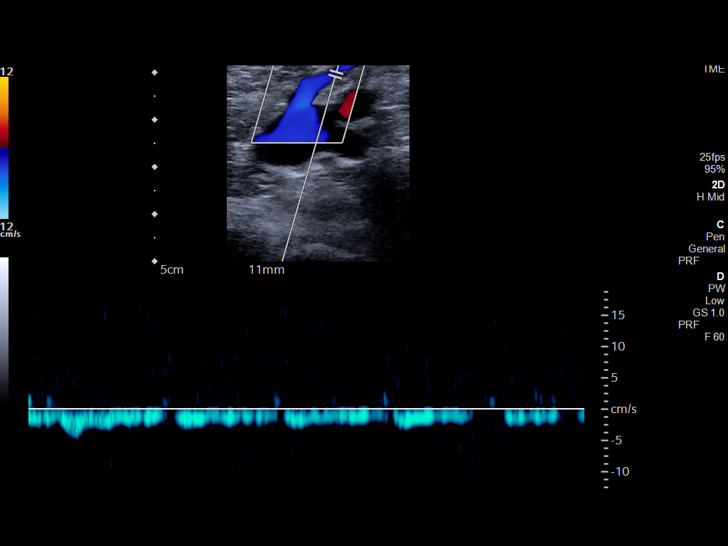
[im 15/34]
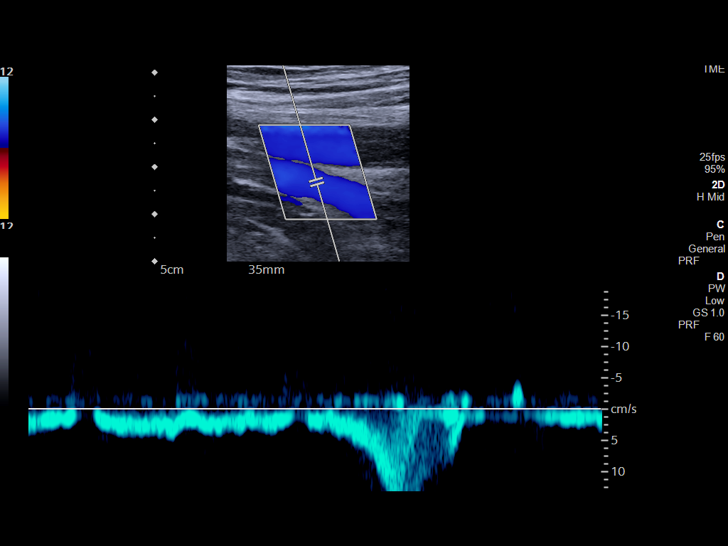
[im 18/34]
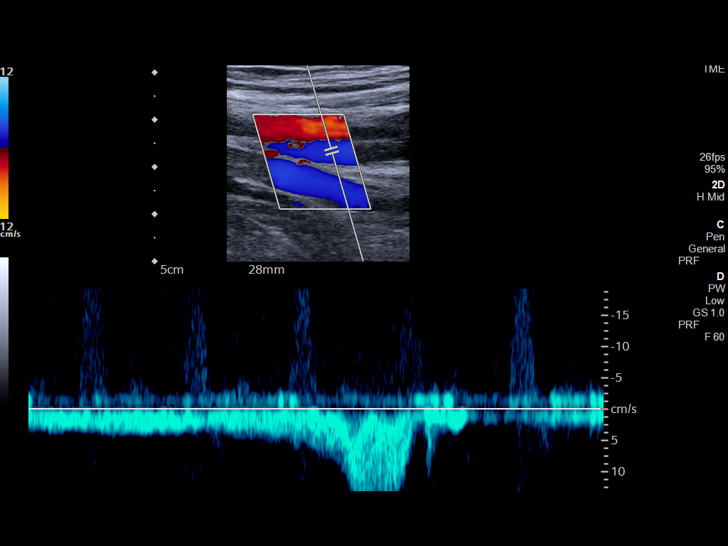
[im 19/34]
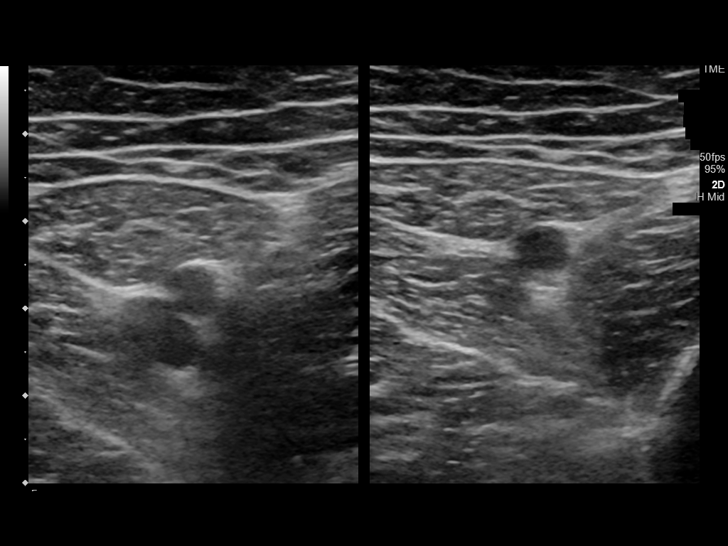
[im 22/34]
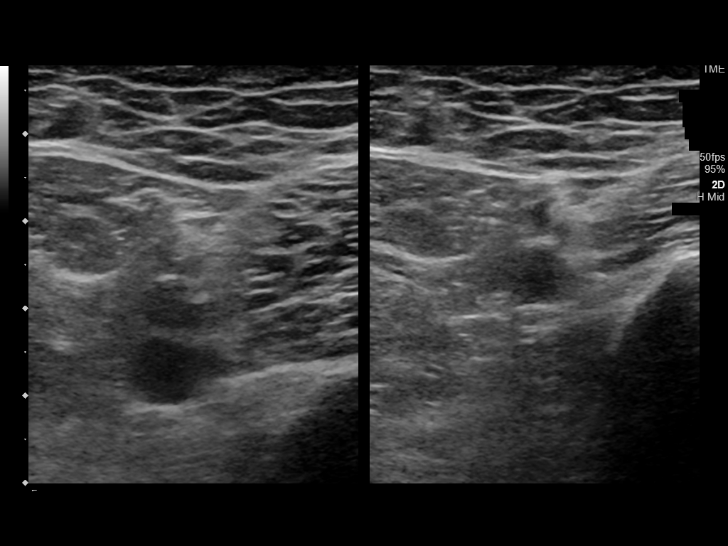
[im 25/34]
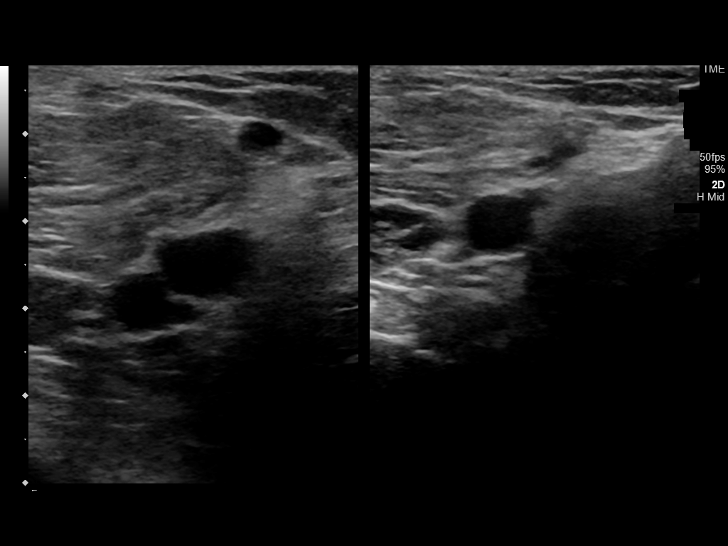
[im 28/34]
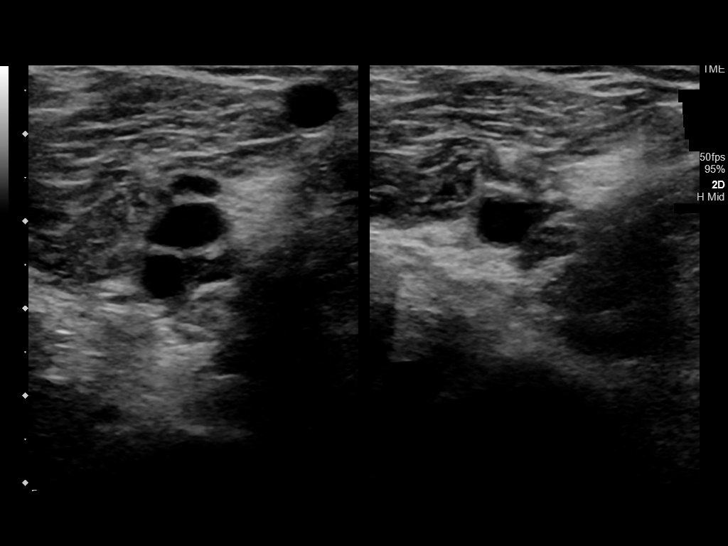
[im 31/34]
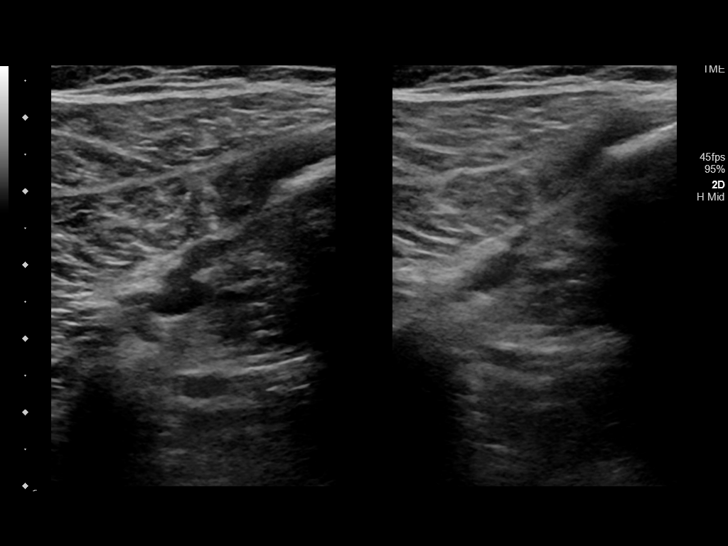
[im 34/34]
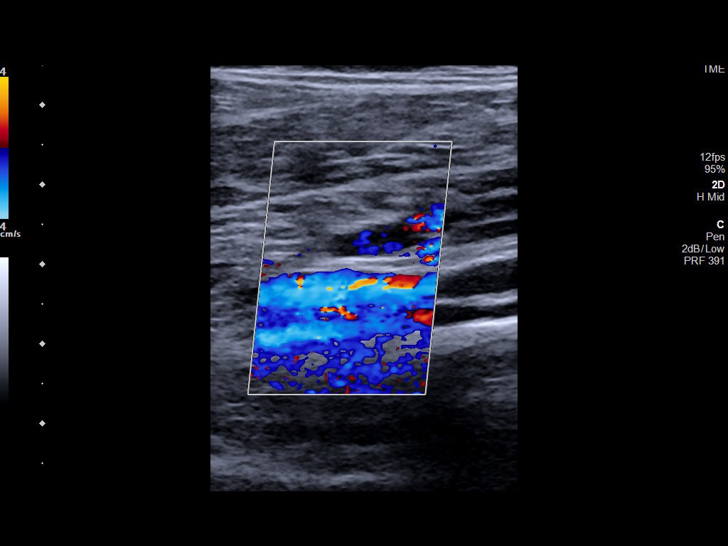

[13 of 24 positions shown; findings below may reference images not displayed]

FINDINGS: Contralateral Common Femoral Vein: Respiratory phasicity is normal
and symmetric with the symptomatic side. No evidence of thrombus.
Normal compressibility.

Common Femoral Vein: No evidence of thrombus. Normal
compressibility, respiratory phasicity and response to augmentation.

Saphenofemoral Junction: No evidence of thrombus. Normal
compressibility and flow on color Doppler imaging.

Profunda Femoral Vein: No evidence of thrombus. Normal
compressibility and flow on color Doppler imaging.

Femoral Vein: No evidence of thrombus. Normal compressibility,
respiratory phasicity and response to augmentation.

Popliteal Vein: No evidence of thrombus. Normal compressibility,
respiratory phasicity and response to augmentation.

Calf Veins: No evidence of thrombus. Normal compressibility and flow
on color Doppler imaging.

Superficial Great Saphenous Vein: No evidence of thrombus. Normal
compressibility.

Venous Reflux:  None.

Other Findings:  None.
IMPRESSION: No evidence of deep venous thrombosis.

## 2021-04-02 IMAGING — US US SCROTUM W/ DOPPLER COMPLETE
1 series · 14 of 25 positions shown · non-contrast
Comparison: None.

CLINICAL DATA: Right-sided testicular pain

EXAM:
SCROTAL ULTRASOUND
DOPPLER ULTRASOUND OF THE TESTICLES
TECHNIQUE: Complete ultrasound examination of the testicles, epididymis, and
other scrotal structures was performed. Color and spectral Doppler
ultrasound were also utilized to evaluate blood flow to the
testicles.

[Series 1: us scrotum w/ doppler complete · 14 of 79 slices shown]
[im 1/79]
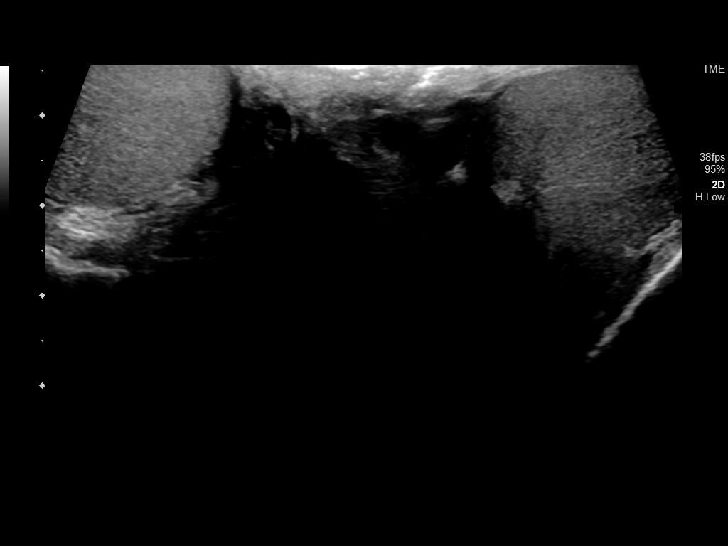
[im 7/79]
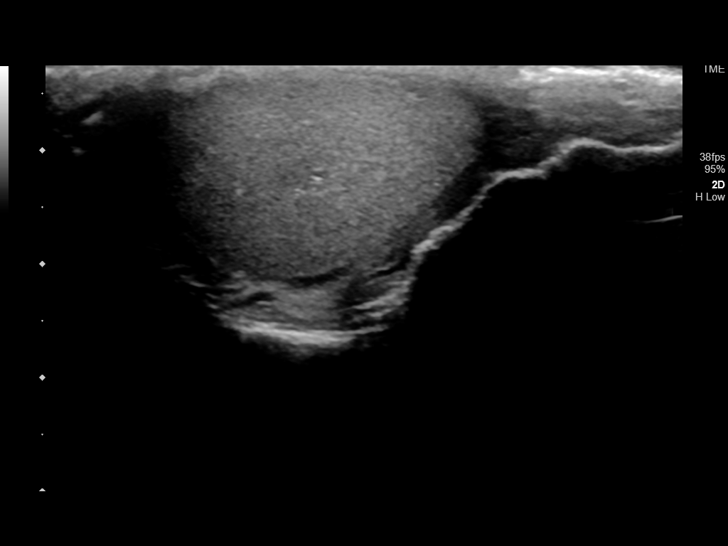
[im 14/79]
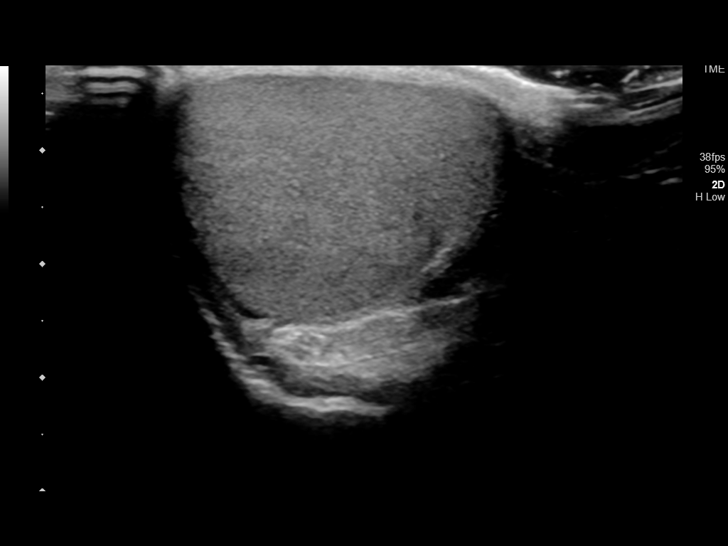
[im 20/79]
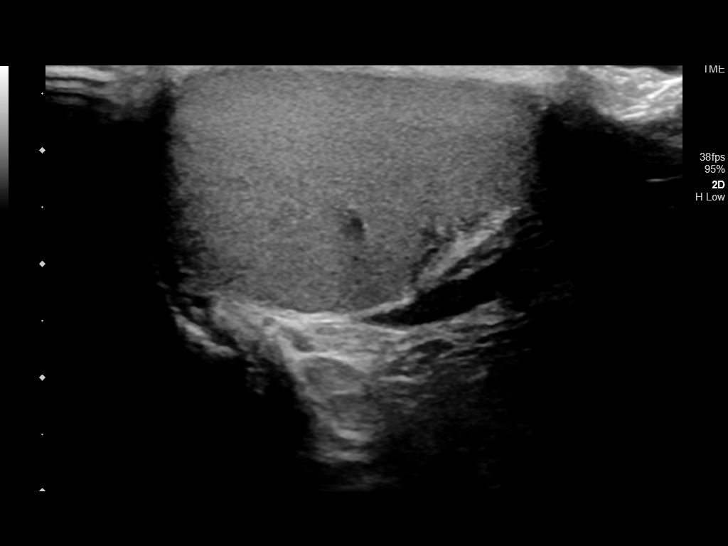
[im 27/79]
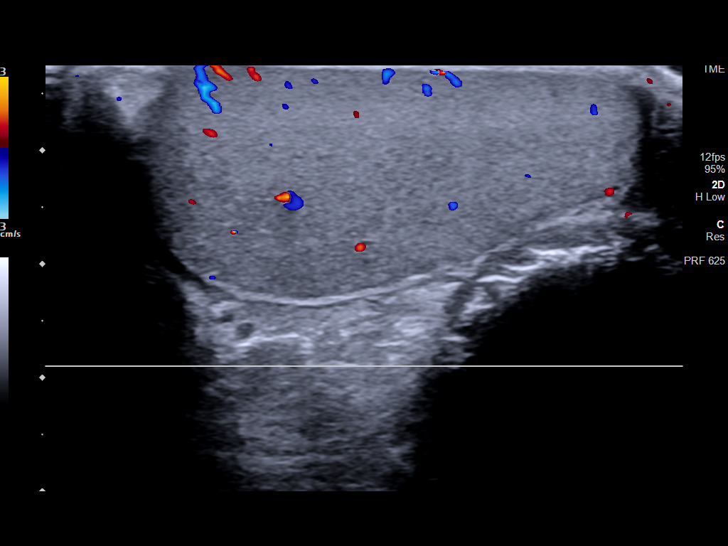
[im 30/79]
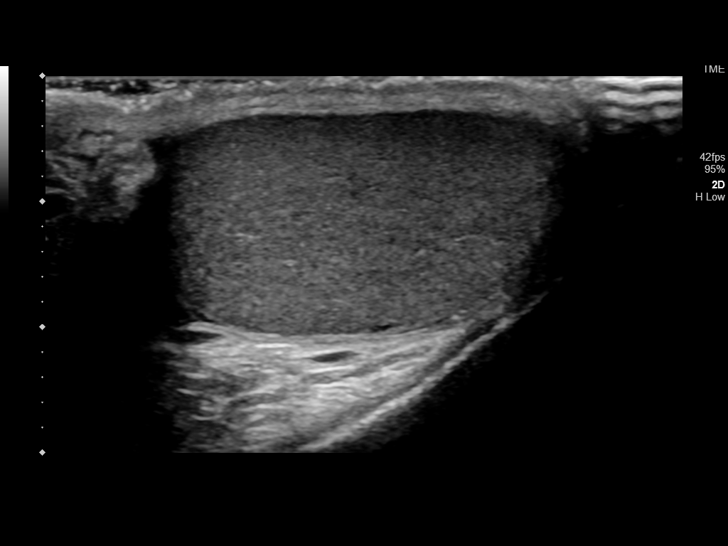
[im 36/79]
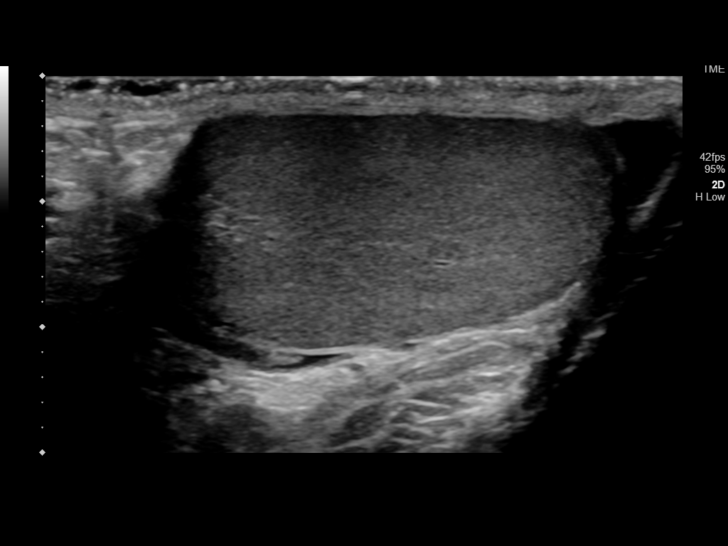
[im 43/79]
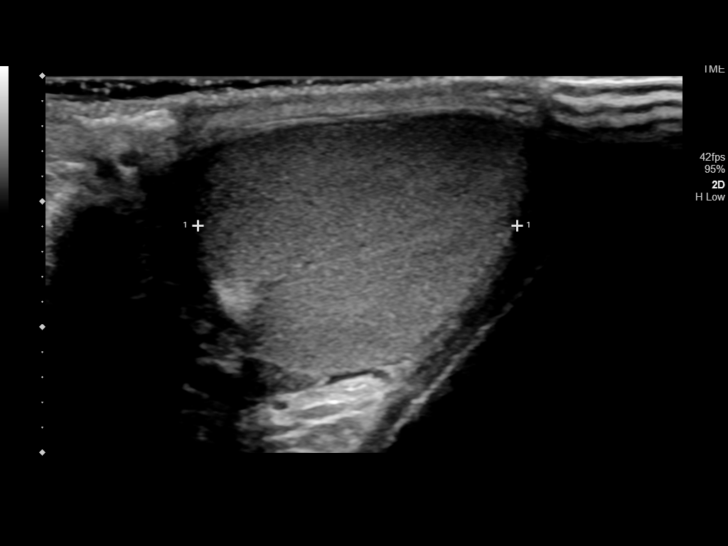
[im 49/79]
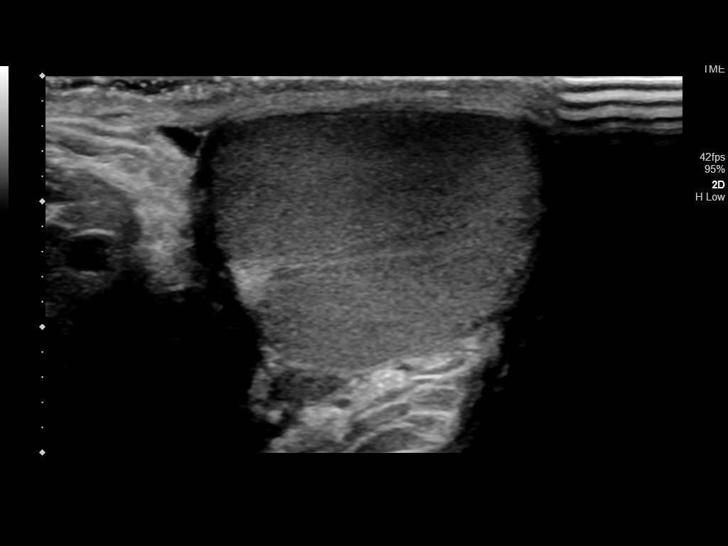
[im 53/79]
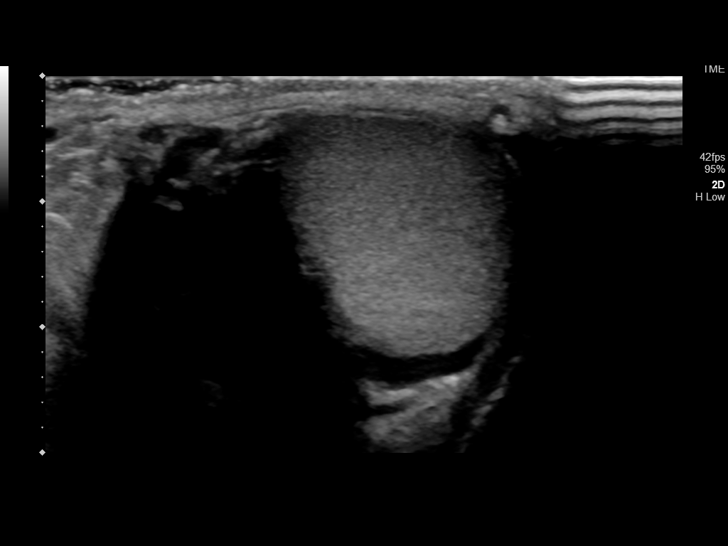
[im 59/79]
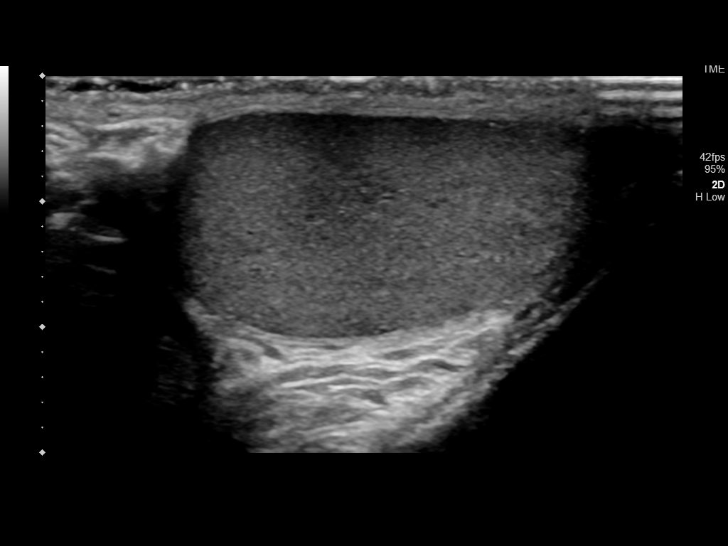
[im 66/79]
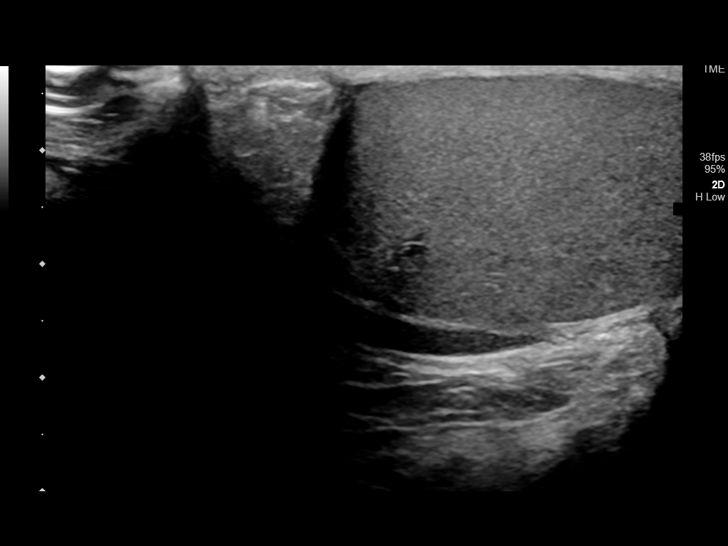
[im 72/79]
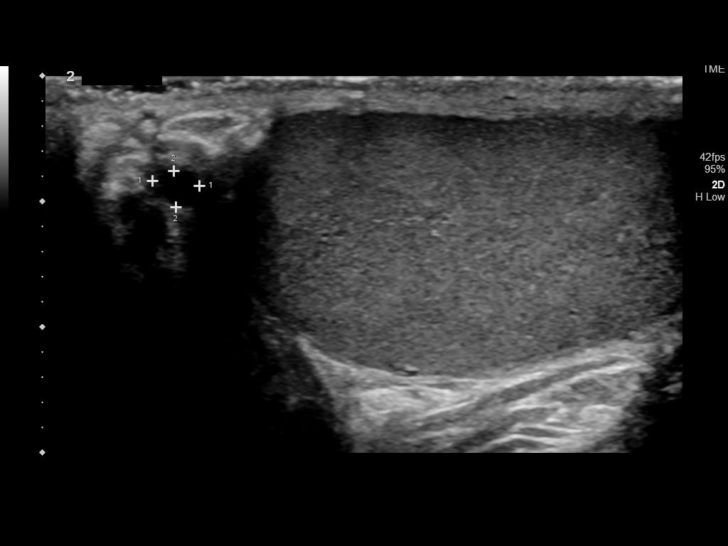
[im 79/79]
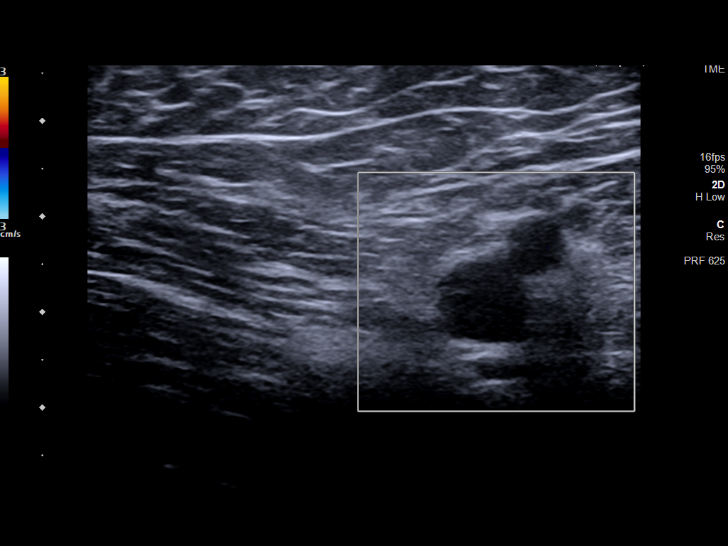

[14 of 25 positions shown; findings below may reference images not displayed]

FINDINGS: Right testicle

Measurements: 4.4 x 2.0 x 2.8 cm. No mass or microlithiasis
visualized.

Left testicle

Measurements: 3.1 x 1.7 x 2.5 cm. No mass or microlithiasis
visualized.

Right epididymis:  Normal in size and appearance.

Left epididymis: Small left epididymal cyst is noted measuring 4 mm.

Hydrocele:  None visualized.

Varicocele:  None visualized.

Pulsed Doppler interrogation of both testes demonstrates normal low
resistance arterial and venous waveforms bilaterally.
IMPRESSION: Small left epididymal cyst.

Normal-appearing testicles bilaterally.

## 2024-07-21 ENCOUNTER — Ambulatory Visit: Payer: Self-pay

## 2024-07-21 NOTE — Telephone Encounter (Signed)
 FYI Only or Action Required?: FYI only for provider: appointment scheduled on 12/02.  Called Nurse Triage reporting Dizziness.  Symptoms began several weeks ago.  Interventions attempted: Other: was seen at urgent care.  Symptoms are: unchanged.  Triage Disposition: See PCP Within 2 Weeks  Patient/caregiver understands and will follow disposition?: Yes     Copied from CRM [id number]. Topic: Clinical - Red Word Triage >> Jul 21, 2024 12:45 PM Alfonso ORN wrote: Red Word that prompted transfer to Nurse Triage: lightheadness, high bp f/u went to urgent care        Reason for Disposition  [1] MILD dizziness (e.g., vertigo; walking normally) AND [2] has been evaluated by doctor (or NP/PA) for this  Answer Assessment - Initial Assessment Questions Earlier new patient appointment scheduled for the patient. Patient advised to go to the mobile medicine unit to be evaluated for his blood pressure today, which he is agreeable with.     1. DESCRIPTION: Describe your dizziness.     About a week ago 2. VERTIGO: Do you feel like either you or the room is spinning or tilting?      Yes 3. LIGHTHEADED: Do you feel lightheaded? (e.g., somewhat faint, woozy, weak upon standing)     Yes 4. SEVERITY: How bad is it?  Can you walk?     Mild to moderate  5. ONSET:  When did the dizziness begin?     A couple of weeks ago  7. CAUSE: What do you think is causing the dizziness?     Unsure  8. RECURRENT SYMPTOM: Have you had dizziness before? If Yes, ask: When was the last time? What happened that time?     151/100 9. OTHER SYMPTOMS: Do you have any other symptoms? (e.g., earache, headache, numbness, tinnitus, vomiting, weakness)     Elevated blood pressure  Protocols used: Dizziness - Vertigo-A-AH

## 2024-07-29 ENCOUNTER — Encounter: Payer: Self-pay | Admitting: Family Medicine

## 2024-07-29 ENCOUNTER — Ambulatory Visit: Admitting: Family Medicine

## 2024-07-29 VITALS — BP 122/91 | HR 84 | Resp 16 | Ht 69.0 in | Wt 175.0 lb

## 2024-07-29 DIAGNOSIS — R030 Elevated blood-pressure reading, without diagnosis of hypertension: Secondary | ICD-10-CM | POA: Diagnosis not present

## 2024-07-29 DIAGNOSIS — Z7689 Persons encountering health services in other specified circumstances: Secondary | ICD-10-CM | POA: Diagnosis not present

## 2024-07-29 DIAGNOSIS — F900 Attention-deficit hyperactivity disorder, predominantly inattentive type: Secondary | ICD-10-CM

## 2024-07-29 NOTE — Patient Instructions (Signed)

## 2024-07-29 NOTE — Progress Notes (Signed)
 New Patient Office Visit  Subjective   Patient ID: Todd Reed, male    DOB: 1980-09-13  Age: 43 y.o. MRN: [id number]  CC:  Chief Complaint  Patient presents with   Establish Care   Discussed the use of AI scribe software for clinical note transcription with the patient, who gave verbal consent to proceed.  History of Present Illness    Todd Reed is a 43 year old male who presents to establish with [facility] at [facility]. He presents with dizziness and lightheadedness.  He has been experiencing dizziness and lightheadedness, initially diagnosed as vertigo by an urgent care facility. The dizziness was severe for about a week and now occurs randomly once or twice a day, feeling more like lightheadedness. The episodes are more frequent when transitioning from lying down to standing up.  His blood pressure has been high, with home readings of 151/100 and 147/106 (on 11/24 when he made this appt), and a reading of 144/100 at the urgent care on November 18th. UC work-up was benign with EKG performed with NSR at 68bpm, normal CBC and CMP. He has started monitoring his blood pressure at home and has purchased a blood pressure cuff. No family history of high blood pressure or heart issues is noted. He has been using Flonase since Thanksgiving, which he feels has helped with his symptoms. No sinus pressure, pain, or nasal congestion. The medication given for vertigo made him feel worse, and he stopped it after two and a half days.  He has a past medical history of ADD and a hernia repair. He is currently on Valtrex  for occasional fever blisters and has stopped taking Adderall since the onset of dizziness. He has also avoided caffeine. He feels tired all the time for the past year or two.  He runs two [employer] and describes his work as stressful, especially during the holiday season. He has reduced his alcohol intake to once a week, whereas in the past he used to  drink every night.    Outpatient Encounter Medications as of 07/29/2024  Medication Sig   albuterol (VENTOLIN HFA) 108 (90 Base) MCG/ACT inhaler Inhale 2 puffs into the lungs.   valACYclovir  (VALTREX ) 1000 MG tablet TAKE 2 TABLETS BY MOUTH 2 TIMES A DAY FOR 1 DAY WHEN BLISTERS APPEAR   vitamin C (ASCORBIC ACID) 500 MG tablet Take 500 mg by mouth daily.   amphetamine -dextroamphetamine (ADDERALL) 20 MG tablet Take 20 mg by mouth 3 (three) times daily. (Patient not taking: Reported on 07/29/2024)   No facility-administered encounter medications on file as of 07/29/2024.   Patient Active Problem List   Diagnosis Date Noted   Attention deficit hyperactivity disorder, predominantly inattentive type 10/24/2019   Influenza A 10/19/2015   Streptococcal sore throat 04/02/2015   Lymphadenopathy of right cervical region 08/12/2014   Rash and nonspecific skin eruption 04/16/2014   RENAL CALCULUS 07/16/2008   ADD 10/31/2007   PANIC ATTACK 09/20/2007   UMBILICAL HERNIA 04/02/2007   FREQUENCY, URINARY 04/02/2007   Past Medical History:  Diagnosis Date   ADD (attention deficit disorder)    Dx in middle school   Ankle fracture, left    No surgery   Kidney stones    Sees Urology and prescribed Vicodin   Right arm fracture    No surgery   Past Surgical History:  Procedure Laterality Date   APPENDECTOMY     early childhood   HERNIA REPAIR     Umbilical,  as a child   Family History  Problem Relation Age of Onset   Cancer Mother        Breast cancer, in remission 2012   Hypertension Neg Hx    Diabetes Neg Hx    Stroke Neg Hx    Heart disease Neg Hx    Social History   Socioeconomic History   Marital status: Married    Spouse name: Not on file   Number of children: 0   Years of education: Not on file   Highest education level: Not on file  Occupational History   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     valACYclovir  (VALTREX ) 1000 MG tablet TAKE 2 TABLETS BY MOUTH 2 TIMES A DAY FOR 1 DAY WHEN BLISTERS APPEAR 12 tablet 0   vitamin C (ASCORBIC ACID) 500 MG tablet Take 500 mg by mouth daily.     amphetamine -dextroamphetamine (ADDERALL) 20 MG tablet Take 20 mg by mouth 3 (three) times daily. (Patient not taking: Reported on 07/29/2024)     No facility-administered medications prior to visit.   Allergies  Allergen Reactions   Sulfonamide Derivatives     REACTION: ? reaction   ROS: see HPI    Objective  Today's Vitals   07/29/24 1338  BP: (!) 122/91  Pulse: 84  Resp: 16  SpO2: 99%  Weight: 175 lb (79.4 kg)  Height: 5' 9 (1.753 m)  PainSc: 0-No pain   GENERAL: Well-appearing, in NAD. Well nourished.  SKIN: Pink, warm and dry. No rash, lesion, ulceration, or ecchymoses.  Head: Normocephalic. NECK: Trachea midline. Full ROM w/o pain or tenderness. No lymphadenopathy.  EARS: Tympanic membranes are intact, translucent without bulging and without drainage.  Appropriate landmarks visualized.  EYES: Conjunctiva clear without exudates. EOMI, PERRL, no drainage present.  NOSE: Septum midline w/o deformity. Nares patent, mucosa pink and non-inflamed w/o drainage. No sinus tenderness.  THROAT: Uvula midline. Oropharynx clear. Tonsils non-inflamed without exudate. Mucous membranes pink and moist.  RESPIRATORY: Chest wall symmetrical. Respirations even and non-labored. Breath sounds clear to auscultation bilaterally.  CARDIAC: S1, S2 present, regular rate and rhythm without murmur or gallops. Peripheral pulses 2+ bilaterally.  MSK: Muscle tone and strength appropriate for age. Joints w/o tenderness, redness, or swelling.  EXTREMITIES: Without clubbing, cyanosis, or edema.  NEUROLOGIC:  No motor or sensory deficits. Steady, even gait. C2-C12 intact.  PSYCH/MENTAL STATUS: Alert, oriented x 3. Cooperative, appropriate mood and affect.     Assessment & Plan:   1. Encounter to establish care (Primary) Patient is a 59- year-old male who presents today to establish care with primary care at [facility]. Reviewed the past medical history, family history, social history, surgical history, medications and allergies today- updates made as indicated. Patient has concerns today about recent elevated blood pressure readings and lightheadedness.   2. Elevated blood pressure reading in office without diagnosis of hypertension Patient presents today with slightly elevated blood pressure. Patient in no acute distress and is well-appearing. Denies chest pain, shortness of breath, lower extremity edema, vision changes, headaches. Cardiovascular exam with heart regular rate and rhythm. Normal heart sounds, no murmurs present. No lower extremity edema present. Lungs clear to auscultation bilaterally. Patient is not currently on pharmacotherapy. Advised patient to closely monitor blood pressure, and return to office sooner if blood pressure begins to increase greater than 130/80. No  family history of hypertension. Influenced by stress and lifestyle factors. Intermittent dizziness improved with Flonase, suggesting sinus or inner ear involvement. Possible causes include inner ear fluid, allergies, or medication side effects. Continue Flonase as needed for symptom relief. Monitor blood pressure at home and record readings. Advised on lifestyle modifications: reduce salt intake, manage stress, and exercise regularly. Scheduled follow-up in 4-6 weeks to reassess blood pressure.  3. Attention deficit hyperactivity disorder, predominantly inattentive type Followed by psychiatry. Currently is not taking Adderall due to lightheadedness sensations and elevated blood pressure readings.     Return in about 5 weeks (around 09/02/2024) for HTN follow-up.   Evalene Arts, FNP

## 2024-09-01 ENCOUNTER — Ambulatory Visit: Admitting: General Practice

## 2024-09-02 ENCOUNTER — Ambulatory Visit: Admitting: Family Medicine
